# Patient Record
Sex: Female | Born: 1985 | Race: Black or African American | Hispanic: No | Marital: Single | Smoking: Former smoker
Health system: Southern US, Community
[De-identification: ages and names within clinical notes are randomized; demographics above are authoritative.]

## PROBLEM LIST (undated history)

## (undated) ENCOUNTER — Inpatient Hospital Stay (HOSPITAL_COMMUNITY): Payer: Self-pay

## (undated) DIAGNOSIS — N760 Acute vaginitis: Secondary | ICD-10-CM

## (undated) DIAGNOSIS — K219 Gastro-esophageal reflux disease without esophagitis: Secondary | ICD-10-CM

## (undated) DIAGNOSIS — B9689 Other specified bacterial agents as the cause of diseases classified elsewhere: Secondary | ICD-10-CM

## (undated) DIAGNOSIS — J40 Bronchitis, not specified as acute or chronic: Secondary | ICD-10-CM

## (undated) HISTORY — PX: WISDOM TOOTH EXTRACTION: SHX21

## (undated) HISTORY — DX: Gastro-esophageal reflux disease without esophagitis: K21.9

---

## 2006-01-06 ENCOUNTER — Inpatient Hospital Stay (HOSPITAL_COMMUNITY): Admission: AD | Admit: 2006-01-06 | Discharge: 2006-01-06 | Payer: Self-pay | Admitting: Obstetrics & Gynecology

## 2006-04-23 ENCOUNTER — Emergency Department (HOSPITAL_COMMUNITY): Admission: EM | Admit: 2006-04-23 | Discharge: 2006-04-23 | Payer: Self-pay | Admitting: Emergency Medicine

## 2006-04-28 ENCOUNTER — Inpatient Hospital Stay (HOSPITAL_COMMUNITY): Admission: AD | Admit: 2006-04-28 | Discharge: 2006-04-28 | Payer: Self-pay | Admitting: Obstetrics and Gynecology

## 2006-06-01 ENCOUNTER — Emergency Department (HOSPITAL_COMMUNITY): Admission: EM | Admit: 2006-06-01 | Discharge: 2006-06-01 | Payer: Self-pay | Admitting: Family Medicine

## 2006-07-24 ENCOUNTER — Inpatient Hospital Stay (HOSPITAL_COMMUNITY): Admission: AD | Admit: 2006-07-24 | Discharge: 2006-07-25 | Payer: Self-pay | Admitting: Gynecology

## 2006-08-19 ENCOUNTER — Emergency Department (HOSPITAL_COMMUNITY): Admission: EM | Admit: 2006-08-19 | Discharge: 2006-08-19 | Payer: Self-pay | Admitting: Emergency Medicine

## 2006-08-21 ENCOUNTER — Inpatient Hospital Stay (HOSPITAL_COMMUNITY): Admission: AD | Admit: 2006-08-21 | Discharge: 2006-08-21 | Payer: Self-pay | Admitting: Obstetrics & Gynecology

## 2006-09-04 ENCOUNTER — Ambulatory Visit: Payer: Self-pay | Admitting: Obstetrics and Gynecology

## 2006-10-08 ENCOUNTER — Encounter: Payer: Self-pay | Admitting: Obstetrics & Gynecology

## 2006-10-08 ENCOUNTER — Encounter: Payer: Self-pay | Admitting: Obstetrics and Gynecology

## 2006-10-08 ENCOUNTER — Ambulatory Visit: Payer: Self-pay | Admitting: Obstetrics & Gynecology

## 2006-10-10 ENCOUNTER — Ambulatory Visit (HOSPITAL_COMMUNITY): Admission: RE | Admit: 2006-10-10 | Discharge: 2006-10-10 | Payer: Self-pay | Admitting: Family Medicine

## 2006-10-29 ENCOUNTER — Ambulatory Visit: Payer: Self-pay | Admitting: Obstetrics & Gynecology

## 2006-12-07 ENCOUNTER — Emergency Department: Payer: Self-pay

## 2007-09-12 ENCOUNTER — Emergency Department (HOSPITAL_COMMUNITY): Admission: EM | Admit: 2007-09-12 | Discharge: 2007-09-12 | Payer: Self-pay | Admitting: Emergency Medicine

## 2008-05-05 ENCOUNTER — Emergency Department (HOSPITAL_COMMUNITY): Admission: EM | Admit: 2008-05-05 | Discharge: 2008-05-06 | Payer: Self-pay | Admitting: Emergency Medicine

## 2008-07-24 ENCOUNTER — Emergency Department (HOSPITAL_COMMUNITY): Admission: EM | Admit: 2008-07-24 | Discharge: 2008-07-24 | Payer: Self-pay | Admitting: Emergency Medicine

## 2008-07-25 ENCOUNTER — Emergency Department (HOSPITAL_COMMUNITY): Admission: EM | Admit: 2008-07-25 | Discharge: 2008-07-25 | Payer: Self-pay | Admitting: Emergency Medicine

## 2008-08-02 ENCOUNTER — Ambulatory Visit: Payer: Self-pay | Admitting: Obstetrics and Gynecology

## 2008-10-15 ENCOUNTER — Emergency Department: Payer: Self-pay | Admitting: Internal Medicine

## 2009-07-14 ENCOUNTER — Emergency Department: Payer: Self-pay | Admitting: Unknown Physician Specialty

## 2009-07-16 ENCOUNTER — Emergency Department: Payer: Self-pay | Admitting: Emergency Medicine

## 2009-08-30 ENCOUNTER — Emergency Department: Payer: Self-pay | Admitting: Emergency Medicine

## 2010-06-26 LAB — DIFFERENTIAL
Eosinophils Absolute: 0 10*3/uL (ref 0.0–0.7)
Eosinophils Relative: 0 % (ref 0–5)
Lymphocytes Relative: 12 % (ref 12–46)
Lymphs Abs: 1 10*3/uL (ref 0.7–4.0)
Monocytes Absolute: 1 10*3/uL (ref 0.1–1.0)
Neutro Abs: 6.2 10*3/uL (ref 1.7–7.7)

## 2010-06-26 LAB — CBC
HCT: 38 % (ref 36.0–46.0)
Hemoglobin: 13 g/dL (ref 12.0–15.0)
MCHC: 34.2 g/dL (ref 30.0–36.0)
Platelets: 353 10*3/uL (ref 150–400)
RDW: 15.1 % (ref 11.5–15.5)

## 2010-06-26 LAB — URINALYSIS, ROUTINE W REFLEX MICROSCOPIC
Glucose, UA: NEGATIVE mg/dL
Ketones, ur: >80 mg/dL — AB
Leukocytes, UA: NEGATIVE
Specific Gravity, Urine: >1.03 — ABNORMAL HIGH (ref 1.005–1.030)
pH: 5.5 (ref 5.0–8.0)

## 2010-06-26 LAB — COMPREHENSIVE METABOLIC PANEL
Albumin: 3.9 g/dL (ref 3.5–5.2)
Alkaline Phosphatase: 59 U/L (ref 39–117)
BUN: 7 mg/dL (ref 6–23)
CO2: 21 mEq/L (ref 19–32)
Calcium: 9.1 mg/dL (ref 8.4–10.5)
Chloride: 105 mEq/L (ref 96–112)
Creatinine, Ser: 0.85 mg/dL (ref 0.4–1.2)
GFR calc non Af Amer: >60 mL/min (ref >60)
Glucose, Bld: 79 mg/dL (ref 70–99)
Total Bilirubin: 0.5 mg/dL (ref 0.3–1.2)
Total Protein: 7.4 g/dL (ref 6.0–8.3)

## 2010-06-26 LAB — POCT CARDIAC MARKERS
CKMB, poc: 1.7 ng/mL (ref 1.0–8.0)
Myoglobin, poc: 40.1 ng/mL (ref 12–200)

## 2010-06-26 LAB — URINE MICROSCOPIC-ADD ON

## 2010-06-26 LAB — BASIC METABOLIC PANEL
BUN: 6 mg/dL (ref 6–23)
CO2: 26 mEq/L (ref 19–32)
Creatinine, Ser: 0.68 mg/dL (ref 0.4–1.2)
GFR calc Af Amer: >60 mL/min (ref >60)

## 2010-06-26 LAB — LIPASE, BLOOD: Lipase: 18 U/L (ref 11–59)

## 2010-07-03 LAB — BASIC METABOLIC PANEL
BUN: 6 mg/dL (ref 6–23)
CO2: 24 mEq/L (ref 19–32)
Calcium: 9.2 mg/dL (ref 8.4–10.5)
Chloride: 108 mEq/L (ref 96–112)
Creatinine, Ser: 0.64 mg/dL (ref 0.4–1.2)
GFR calc non Af Amer: >60 mL/min (ref >60)
Potassium: 3.5 mEq/L (ref 3.5–5.1)

## 2010-07-03 LAB — DIFFERENTIAL
Basophils Absolute: 0 10*3/uL (ref 0.0–0.1)
Basophils Relative: 1 % (ref 0–1)
Eosinophils Absolute: 0.1 10*3/uL (ref 0.0–0.7)
Eosinophils Relative: 1 % (ref 0–5)
Lymphs Abs: 1.7 10*3/uL (ref 0.7–4.0)
Monocytes Absolute: 0.7 10*3/uL (ref 0.1–1.0)
Monocytes Relative: 10 % (ref 3–12)
Neutro Abs: 4.3 10*3/uL (ref 1.7–7.7)

## 2010-07-03 LAB — GC/CHLAMYDIA PROBE AMP, GENITAL: GC Probe Amp, Genital: NEGATIVE

## 2010-07-03 LAB — URINALYSIS, ROUTINE W REFLEX MICROSCOPIC
Bilirubin Urine: NEGATIVE
Protein, ur: NEGATIVE mg/dL
pH: 6 (ref 5.0–8.0)

## 2010-07-03 LAB — ABO/RH: ABO/RH(D): O POS

## 2010-07-03 LAB — CBC
MCHC: 34.2 g/dL (ref 30.0–36.0)
MCV: 85.5 fL (ref 78.0–100.0)
Platelets: 331 10*3/uL (ref 150–400)
WBC: 6.8 10*3/uL (ref 4.0–10.5)

## 2010-07-03 LAB — WET PREP, GENITAL
Trich, Wet Prep: NONE SEEN
Yeast Wet Prep HPF POC: NONE SEEN

## 2010-07-13 ENCOUNTER — Emergency Department: Payer: Self-pay | Admitting: Emergency Medicine

## 2010-07-31 NOTE — Group Therapy Note (Signed)
NAMECHARLETTE, Diana Serrano               ACCOUNT NO.:  [id number]   MEDICAL RECORD NO.:  [id number]          PATIENT TYPE:  WOC   LOCATION:  [facility]                   FACILITY:  [facility]   PHYSICIAN:  Allie Bossier, MD        DATE OF BIRTH:  20-May-1985   DATE OF SERVICE:  10/08/2006                                  CLINIC NOTE   CHIEF COMPLAINT:  Lower abdominal pain x1 year and Pap smear.   HISTORY OF PRESENT ILLNESS:  This is a 25 year old African-American  female who reports a 1-year history of intermittent abdominal pain  located in the suprapubic region.  The pain occurs approximately twice  per month on average and is not related to any particular point in her  menstrual cycle.  When the pain occurs, it lasts approximately 30  minutes and is severe in nature and described as more severe than  menstrual cramping.  She reports that her periods are regular and  predictable with moderate flow lasting 4 days with no bleeding between  periods.  She has been evaluated in [facility] in June  2008 for this same problem.  At that time, gonorrhea and chlamydia  cultures and urinalysis were all negative.  She reports that the pain  has been increasing in intensity over the past several months.  She  reports normal bowels, no vomiting, no dysuria, no vaginal discharge.   OBSTETRIC HISTORY:  Gravida-0.   GYNECOLOGICAL HISTORY:  The patient has never had a Pap smear.  She  reports that she has been sexually active for about 4 years now.  No  history of any sexually transmitted diseases.   PAST MEDICAL HISTORY:  None.   MEDICATIONS:  None.   PAST SURGICAL HISTORY:  None.   FAMILY HISTORY:  Patient's mother has high blood pressure and  grandfather has diabetes.   SOCIAL HISTORY:  The patient denies alcohol, tobacco and drug use.  As  mentioned previously, she has been sexually active with 1 partner since  2006.  She drinks approximately 2 caffeinated beverages daily.   PHYSICAL EXAMINATION:  VITAL SIGNS:  Temperature 97.5, pulse 84, blood  pressure 104/65, weight 93 kg.  GENERAL:  Pleasant, obese African-American female.  CARDIOVASCULAR:  Regular rate and rhythm without murmur.  LUNGS:  Clear to auscultation bilaterally with normal effort.  ABDOMEN:  Obese, nontender, nondistended.  No masses.  No guarding.  No  rebound.  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  The introitus is normal.  The  vagina is pink and well-rugated with copious thin white vaginal  discharge.  The uterus appears without lesions.  Bimanual exam performed  and there is no cervical tenderness.  No adnexal tenderness.  No adnexal  fullness or masses.   IMPRESSION AND PLAN:  A 26 year old gravida-0 African-American female  who is sexually active.  She presents with a 1-year history of  intermittent abdominal pain lasting 30 minutes.  1. Abdominal pain.  Gonorrhea and chlamydia were negative last month.      Urinalysis was  also negative at that time.  In order to evaluate,      we will obtain a pelvic ultrasound to evaluate the ovaries.  2. Cervical cancer screening.  A Pap smear was obtained today and the      patient will be notified of the results when available.     ______________________________  Elsie Lincoln, MD    ______________________________  Allie Bossier, MD    KL/MEDQ  D:  10/08/2006  T:  10/09/2006  Job:  [id number]

## 2010-09-08 ENCOUNTER — Emergency Department: Payer: Self-pay | Admitting: Internal Medicine

## 2010-10-21 ENCOUNTER — Emergency Department: Payer: Self-pay | Admitting: Emergency Medicine

## 2010-11-09 ENCOUNTER — Emergency Department: Payer: Self-pay | Admitting: *Deleted

## 2010-11-23 ENCOUNTER — Inpatient Hospital Stay: Payer: Self-pay | Admitting: Psychiatry

## 2010-12-31 LAB — POCT PREGNANCY, URINE
Operator id: 149021
Preg Test, Ur: NEGATIVE

## 2011-01-03 LAB — I-STAT 8, (EC8 V) (CONVERTED LAB)
BUN: 5 — ABNORMAL LOW
Chloride: 105
HCT: 43
Hemoglobin: 14.6
Operator id: 235561
Potassium: 3.2 — ABNORMAL LOW
Sodium: 140
pCO2, Ven: 40.6 — ABNORMAL LOW

## 2011-01-03 LAB — URINALYSIS, ROUTINE W REFLEX MICROSCOPIC
Nitrite: NEGATIVE
Protein, ur: NEGATIVE
Specific Gravity, Urine: 1.02
Urobilinogen, UA: 1

## 2011-01-03 LAB — POCT URINALYSIS DIP (DEVICE)
Glucose, UA: NEGATIVE
Nitrite: NEGATIVE

## 2011-01-03 LAB — POCT PREGNANCY, URINE
Operator id: 235561
Preg Test, Ur: NEGATIVE

## 2011-01-03 LAB — TSH: TSH: 0.91

## 2011-03-31 ENCOUNTER — Emergency Department (HOSPITAL_COMMUNITY)
Admission: EM | Admit: 2011-03-31 | Discharge: 2011-03-31 | Disposition: A | Discharge status: Home / Self Care | Payer: Worker's Compensation | Attending: Emergency Medicine | Admitting: Emergency Medicine

## 2011-03-31 ENCOUNTER — Encounter (HOSPITAL_COMMUNITY): Payer: Self-pay | Admitting: Emergency Medicine

## 2011-03-31 ENCOUNTER — Emergency Department (HOSPITAL_COMMUNITY): Payer: Worker's Compensation

## 2011-03-31 DIAGNOSIS — W19XXXA Unspecified fall, initial encounter: Secondary | ICD-10-CM

## 2011-03-31 DIAGNOSIS — S300XXA Contusion of lower back and pelvis, initial encounter: Secondary | ICD-10-CM | POA: Insufficient documentation

## 2011-03-31 DIAGNOSIS — W010XXA Fall on same level from slipping, tripping and stumbling without subsequent striking against object, initial encounter: Secondary | ICD-10-CM | POA: Insufficient documentation

## 2011-03-31 DIAGNOSIS — IMO0001 Reserved for inherently not codable concepts without codable children: Secondary | ICD-10-CM | POA: Insufficient documentation

## 2011-03-31 LAB — POCT PREGNANCY, URINE: Preg Test, Ur: NEGATIVE

## 2011-03-31 NOTE — ED Notes (Signed)
Pt states she slipped on water in floor last night at work around 2130.  C/o pain to R buttocks and R leg since fall.  Denies LOC.  Denies back and neck pain. [redacted] weeks pregnant.

## 2011-03-31 NOTE — ED Provider Notes (Signed)
History     CSN: [id number]  Arrival date & time 03/31/11  1621   First MD Initiated Contact with Patient 03/31/11 1700      Chief Complaint  Patient presents with  . Fall    (Consider location/radiation/quality/duration/timing/severity/associated sxs/prior treatment) HPI.... slipped on water and fell at [facility]. Patient claims to be [redacted] weeks pregnant. Complains of pain to right buttocks. She is ambulatory without favoring the right leg. No head or neck trauma. Pain is minimal. Fall happened a short time ago.  History reviewed. No pertinent past medical history.  History reviewed. No pertinent past surgical history.  No family history on file.  History  Substance Use Topics  . Smoking status: Former Games developer  . Smokeless tobacco: Not on file  . Alcohol Use: Yes     quit drinking since becoming pregnant    OB History    Grav Para Term Preterm Abortions TAB SAB Ect Mult Living   1               Review of Systems  All other systems reviewed and are negative.    Allergies  Review of patient's allergies indicates no known allergies.  Home Medications   Current Outpatient Rx  Name Route Sig Dispense Refill  . ACETAMINOPHEN 325 MG PO TABS Oral Take 650 mg by mouth every 6 (six) hours as needed. For headache    . PRENATAL MULTIVITAMIN CH Oral Take 1 tablet by mouth daily.      BP 117/58  Pulse 93  Temp(Src) 98.4 F (36.9 C) (Oral)  Resp 20  SpO2 100%  LMP 11/02/2010  Physical Exam  Nursing note and vitals reviewed. Constitutional: She is oriented to person, place, and time. She appears well-developed and well-nourished.  HENT:  Head: Normocephalic and atraumatic.  Eyes: Conjunctivae and EOM are normal. Pupils are equal, round, and reactive to light.  Neck: Normal range of motion. Neck supple.  Cardiovascular: Normal rate and regular rhythm.   Pulmonary/Chest: Effort normal and breath sounds normal.  Abdominal: Soft. Bowel sounds are normal.    Musculoskeletal: Normal range of motion.       Minimal tenderness right lateral buttocks  Neurological: She is alert and oriented to person, place, and time.  Skin: Skin is warm and dry.  Psychiatric: She has a normal mood and affect.    ED Course  Procedures (including critical care time)   Labs Reviewed  POCT PREGNANCY, URINE  LAB REPORT - SCANNED  POCT PREGNANCY, URINE   No results found.   1. Fall   2. Contusion of buttock    No results found. Results for orders placed during the hospital encounter of 03/31/11  POCT PREGNANCY, URINE      Component Value Range   Preg Test, Ur NEGATIVE      MDM  Pregnancy test is negative. Patient is ambulatory. No x-rays are necessary. Discussed negative pregnancy test with patient.        Donnetta Hutching, MD 04/05/11 [phone]

## 2011-04-08 ENCOUNTER — Other Ambulatory Visit: Payer: Self-pay | Admitting: Occupational Medicine

## 2011-04-08 ENCOUNTER — Ambulatory Visit: Payer: Worker's Compensation

## 2011-04-08 DIAGNOSIS — M25569 Pain in unspecified knee: Secondary | ICD-10-CM

## 2011-07-20 ENCOUNTER — Emergency Department (HOSPITAL_COMMUNITY)
Admission: EM | Admit: 2011-07-20 | Discharge: 2011-07-20 | Disposition: A | Discharge status: Home / Self Care | Payer: Self-pay | Attending: Emergency Medicine | Admitting: Emergency Medicine

## 2011-07-20 ENCOUNTER — Encounter (HOSPITAL_COMMUNITY): Payer: Self-pay | Admitting: Emergency Medicine

## 2011-07-20 DIAGNOSIS — K2990 Gastroduodenitis, unspecified, without bleeding: Secondary | ICD-10-CM | POA: Insufficient documentation

## 2011-07-20 DIAGNOSIS — R112 Nausea with vomiting, unspecified: Secondary | ICD-10-CM | POA: Insufficient documentation

## 2011-07-20 DIAGNOSIS — K2970 Gastritis, unspecified, without bleeding: Secondary | ICD-10-CM | POA: Insufficient documentation

## 2011-07-20 MED ORDER — ONDANSETRON 4 MG PO TBDP
8.0000 mg | ORAL_TABLET | Freq: Once | ORAL | Status: AC
  Administered 2011-07-20: 8 mg via ORAL
  Filled 2011-07-20: qty 2

## 2011-07-20 MED ORDER — GI COCKTAIL ~~LOC~~
30.0000 mL | Freq: Once | ORAL | Status: AC
  Administered 2011-07-20: 30 mL via ORAL
  Filled 2011-07-20: qty 30

## 2011-07-20 MED ORDER — FAMOTIDINE 20 MG PO TABS
20.0000 mg | ORAL_TABLET | Freq: Once | ORAL | Status: AC
  Administered 2011-07-20: 20 mg via ORAL
  Filled 2011-07-20: qty 1

## 2011-07-20 NOTE — Discharge Instructions (Signed)
Gastritis Gastritis is an inflammation (the body's way of reacting to injury and/or infection) of the stomach. It is often caused by viral or bacterial (germ) infections. It can also be caused by chemicals (including alcohol) and medications. This illness may be associated with generalized malaise (feeling tired, not well), cramps, and fever. The illness may last 2 to 7 days. If symptoms of gastritis continue, gastroscopy (looking into the stomach with a telescope-like instrument), biopsy (taking tissue samples), and/or blood tests may be necessary to determine the cause. Antibiotics will not affect the illness unless there is a bacterial infection present. One common bacterial cause of gastritis is an organism known as H. Pylori. This can be treated with antibiotics. Other forms of gastritis are caused by too much acid in the stomach. They can be treated with medications such as H2 blockers and antacids. Home treatment is usually all that is needed. Young children will quickly become dehydrated (loss of body fluids) if vomiting and diarrhea are both present. Medications may be given to control nausea. Medications are usually not given for diarrhea unless especially bothersome. Some medications slow the removal of the virus from the gastrointestinal tract. This slows down the healing process. HOME CARE INSTRUCTIONS Home care instructions for nausea and vomiting:  For adults: drink small amounts of fluids often. Drink at least 2 quarts a day. Take sips frequently. Do not drink large amounts of fluid at one time. This may worsen the nausea.   Only take over-the-counter or prescription medicines for pain, discomfort, or fever as directed by your caregiver.   Drink clear liquids only. Those are anything you can see through such as water, broth, or soft drinks.   Once you are keeping clear liquids down, you may start full liquids, soups, juices, and ice cream or sherbet. Slowly add bland (plain, not spicy)  foods to your diet.  Home care instructions for diarrhea:  Diarrhea can be caused by bacterial infections or a virus. Your condition should improve with time, rest, fluids, and/or anti-diarrheal medication.   Until your diarrhea is under control, you should drink clear liquids often in small amounts. Clear liquids include: water, broth, jell-o water and weak tea.  Avoid:  Milk.   Fruits.   Tobacco.   Alcohol.   Extremely hot or cold fluids.   Too much intake of anything at one time.  When your diarrhea stops you may add the following foods, which help the stool to become more formed:  Rice.   Bananas.   Apples without skin.   Dry toast.  Once these foods are tolerated you may add low-fat yogurt and low-fat cottage cheese. They will help to restore the normal bacterial balance in your bowel. Wash your hands well to avoid spreading bacteria (germ) or virus. SEEK IMMEDIATE MEDICAL CARE IF:   You are unable to keep fluids down.   Vomiting or diarrhea become persistent (constant).   Abdominal pain develops, increases, or localizes. (Right sided pain can be appendicitis. Left sided pain in adults can be diverticulitis.)   You develop a fever (an oral temperature above 102 F (38.9 C)).   Diarrhea becomes excessive or contains blood or mucus.   You have excessive weakness, dizziness, fainting or extreme thirst.   You are not improving or you are getting worse.   You have any other questions or concerns.  Document Released: 02/26/2001 Document Revised: 02/21/2011 Document Reviewed: 03/04/2005 Exit[employer] Patient Information 2012 [employer]. 

## 2011-07-20 NOTE — ED Provider Notes (Signed)
History   This chart was scribed for Diana Christen, MD by Melba Coon. The patient was seen in room STRE7/STRE7 and the patient's care was started at 1:35PM.    CSN: [id number]  Arrival date & time 07/20/11  1300   First MD Initiated Contact with Patient 07/20/11 1332      Chief Complaint  Patient presents with  . Nausea  . Emesis    (Consider location/radiation/quality/duration/timing/severity/associated sxs/prior treatment) HPI Diana Serrano is a 26 y.o. female who presents to the Emergency Department complaining of persistent, moderate to severe emesis with associated nausea with an onset this morning. Pt was consuming alcohol last night and is usual to compared to baseline of her normal drinking habits. Pt woke up this morning with the present symptoms. Vomit x6. LNMP: 1 week ago. Pt is unsure if she is pregnant. Pt also c/o sternal chest pain. No HA, fever, neck pain, sore throat, rash, back pain, SOB, diarrhea, dysuria, or extremity pain, edema, weakness, numbness, or tingling. Hx of migraines. No known allergies. No other pertinent medical symptoms.  No past medical history on file.  No past surgical history on file.  No family history on file.  History  Substance Use Topics  . Smoking status: Former Games developer  . Smokeless tobacco: Not on file  . Alcohol Use: Yes     quit drinking since becoming pregnant    OB History    Grav Para Term Preterm Abortions TAB SAB Ect Mult Living   1               Review of Systems 10 Systems reviewed and all are negative for acute change except as noted in the HPI.   Allergies  Review of patient's allergies indicates no known allergies.  Home Medications  No current outpatient prescriptions on file.  BP 103/61  Pulse 84  Temp(Src) 97.7 F (36.5 C) (Oral)  Resp 20  SpO2 100%  LMP 07/15/2011  Breastfeeding? No  Physical Exam  Nursing note and vitals reviewed. Constitutional: She is oriented to person, place, and  time. She appears well-developed and well-nourished. No distress.  HENT:  Head: Normocephalic and atraumatic.  Eyes: EOM are normal. Pupils are equal, round, and reactive to light.  Neck: Normal range of motion. Neck supple. No tracheal deviation present.  Cardiovascular: Normal rate.   No murmur heard. Pulmonary/Chest: Effort normal. No respiratory distress.  Abdominal: Soft. There is no tenderness.  Musculoskeletal: Normal range of motion. She exhibits no edema and no tenderness.  Neurological: She is alert and oriented to person, place, and time.  Skin: Skin is warm and dry.  Psychiatric: She has a normal mood and affect. Her behavior is normal.    ED Course  Procedures (including critical care time)  DIAGNOSTIC STUDIES: Oxygen Saturation is 100% on room air, normal by my interpretation.    COORDINATION OF CARE:  1:37PM - EDMD will order Zofran, PO pepsin, and GI cocktail for the pt.  Labs Reviewed - No data to display No results found.   No diagnosis found.    MDM  Patient with symptoms that may be due to alcohol intake last night.  Patient has a soft and benign abdomen on exam so I do not feel she is an acute surgical process at this time.  She has no tenderness to suggest pancreatitis, cholecystitis or hepatitis.  Patient is afebrile here as well.  Patient may have some mild gastritis and irritation to her esophagus from the vomiting  and acid reflux.  I believe these are the likely etiologies for her chest pain.  Patient has been counseled regarding decreasing alcohol intake here.  I will give her Zofran for her nausea and Pepcid and a GI cocktail to assist with her symptoms.  I personally performed the services described in this documentation, which was scribed in my presence. The recorded information has been reviewed and considered.         Diana Christen, MD 07/20/11 1345

## 2011-07-20 NOTE — ED Notes (Signed)
Patient advises that she has had Nausea and Vomiting since  am. She advises that she has vomited approximately 6 times. She also complains of heaviness in the chest area.  LMP 1 week ago and she is unsure if she is pregnant.

## 2011-08-01 ENCOUNTER — Emergency Department (HOSPITAL_COMMUNITY): Payer: No Typology Code available for payment source

## 2011-08-01 ENCOUNTER — Emergency Department (HOSPITAL_COMMUNITY)
Admission: EM | Admit: 2011-08-01 | Discharge: 2011-08-01 | Disposition: A | Discharge status: Home / Self Care | Payer: Worker's Compensation | Attending: Emergency Medicine | Admitting: Emergency Medicine

## 2011-08-01 ENCOUNTER — Encounter (HOSPITAL_COMMUNITY): Payer: Self-pay | Admitting: Physical Medicine and Rehabilitation

## 2011-08-01 ENCOUNTER — Emergency Department (HOSPITAL_COMMUNITY): Payer: Self-pay

## 2011-08-01 DIAGNOSIS — M25569 Pain in unspecified knee: Secondary | ICD-10-CM | POA: Insufficient documentation

## 2011-08-01 DIAGNOSIS — M25519 Pain in unspecified shoulder: Secondary | ICD-10-CM | POA: Insufficient documentation

## 2011-08-01 DIAGNOSIS — M7989 Other specified soft tissue disorders: Secondary | ICD-10-CM | POA: Insufficient documentation

## 2011-08-01 DIAGNOSIS — IMO0002 Reserved for concepts with insufficient information to code with codable children: Secondary | ICD-10-CM | POA: Insufficient documentation

## 2011-08-01 MED ORDER — DIAZEPAM 5 MG PO TABS: 5.0000 mg | ORAL_TABLET | Freq: Two times a day (BID) | ORAL | Status: AC

## 2011-08-01 MED ORDER — IBUPROFEN 800 MG PO TABS: 800.0000 mg | ORAL_TABLET | Freq: Three times a day (TID) | ORAL | Status: AC
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
## 2011-08-01 NOTE — ED Provider Notes (Signed)
Medical screening examination/treatment/procedure(s) were performed by non-physician practitioner and as supervising physician I was immediately available for consultation/collaboration.  Flint Melter, MD 08/01/11 [phone]

## 2011-08-01 NOTE — Progress Notes (Signed)
Orthopedic Tech Progress Note Patient Details:  Diana Serrano June 17, 1985 [id number]  Other Ortho Devices Type of Ortho Device: ASO;Crutches Ortho Device Location: left ankle Ortho Device Interventions: Application   Batina Dougan T 08/01/2011, 11:00 AM
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
## 2011-08-01 NOTE — ED Notes (Signed)
Pt transported to and from radiology in wheelchair with tech and tolerated well. 

## 2011-08-01 NOTE — Discharge Instructions (Signed)
Your knee xray was normal and did not show signs of any broken bones. This is likely swelling due to the abrasion to your knee. Apply ice to the area and elevate the leg. If you develop trouble walking, numbness/weakness, increased swelling, or any other worrisome symptoms, return to the emergency department. You have been given prescriptions for ibuprofen and Valium (muscle relaxer) - take as needed.  RESOURCE GUIDE  Dental Problems  Patients with Medicaid: [facility] [phone] [address]                                           [phone] [address] Phone:  [phone]                                                  Phone:  [phone]  If unable to pay or uninsured, contact:  [facility] or [facility]. to become qualified for the adult dental clinic.  Chronic Pain Problems Contact [facility]  [phone] Patients need to be referred by their primary care doctor.  Insufficient Money for Medicine Contact [employer]:  call "211" or [facility] [phone].  No Primary Care Doctor Call [facility]  [phone] Other agencies that provide inexpensive medical care    [facility]  [phone]    [facility]  [phone]    [facility]  [phone]    [facility]  [phone]    [facility]  [phone]    [facility]  [phone]  Psychological Services [facility]  [phone] [facility]  [phone] [facility]   [phone] (emergency services [phone])  Substance Abuse Resources [facility]  [phone] [facility] [phone] [facility] [place] [phone] Floydene Flock [phone] Residential & Outpatient Substance Abuse Program  [phone]) [phone]  Abuse/Neglect [facility] Child Abuse Hotline (712)398-3663 [facility] Child Abuse Hotline 985-765-9977 (After  Hours)  Emergency Shelter [facility] [phone]  Maternity Homes [facility] at [facility] [place] [facility] [phone])706-4446 [facility] [phone]  MRSA Hotline #:   (607) 876-5870    [facility] Resources  [facility]     [employer]                          [facility] Dept. [address] [place]                       [address]      [address]  Patrecia Pace  Michell Heinrich Phone:  [phone]                                   Phone:  [phone]                 Phone:  [phone]  [facility] Mental Health Phone:  [phone]  [facility] Child Abuse Hotline 773-045-5132 (214)084-8498 (After Hours)  Knee Pain The knee is the complex joint between your thigh and your lower leg. It is made up of bones, tendons, ligaments, and cartilage. The bones that make up the knee are:  The femur in the thigh.   The tibia and fibula in the lower leg.   The patella or kneecap riding in the groove on the lower femur.  CAUSES  Knee pain is a common complaint with many causes. A few of these causes are:  Injury, such as:   A ruptured ligament or tendon injury.   Torn cartilage.   Medical conditions, such as:   Gout   Arthritis   Infections   Overuse, over training or overdoing a physical activity.  Knee pain can be minor or severe. Knee pain can accompany debilitating injury. Minor knee problems often respond well to self-care measures or get well on their own. More serious injuries may need medical intervention or even surgery. SYMPTOMS The knee is complex. Symptoms of knee problems can vary widely. Some of the problems are:  Pain with movement and weight bearing.   Swelling and tenderness.   Buckling of the knee.   Inability to straighten or extend your knee.   Your knee locks and you cannot straighten it.    Warmth and redness with pain and fever.   Deformity or dislocation of the kneecap.  DIAGNOSIS  Determining what is wrong may be very straight forward such as when there is an injury. It can also be challenging because of the complexity of the knee. Tests to make a diagnosis may include:  Your caregiver taking a history and doing a physical exam.   Routine X-rays can be used to rule out other problems. X-rays will not reveal a cartilage tear. Some injuries of the knee can be diagnosed by:   Arthroscopy a surgical technique by which a small video camera is inserted through tiny incisions on the sides of the knee. This procedure is used to examine and repair internal knee joint problems. Tiny instruments can be used during arthroscopy to repair the torn knee cartilage (meniscus).   Arthrography is a radiology technique. A contrast liquid is directly injected into the knee joint. Internal structures of the knee joint then become visible on X-ray film.   An MRI scan is a non x-ray radiology procedure in which magnetic fields and a computer produce two- or three-dimensional images of the inside of the knee. Cartilage tears are often visible using an MRI scanner. MRI scans have largely replaced arthrography in diagnosing cartilage tears of the knee.   Blood work.   Examination of the fluid that helps to lubricate the knee joint (synovial fluid). This is done by taking a sample out using a needle and a syringe.  TREATMENT The treatment of knee problems depends on the cause. Some of these treatments are:  Depending on the injury, proper casting, splinting, surgery or physical therapy care will be needed.   Give yourself adequate recovery time. Do not overuse your joints. If you begin  to get sore during workout routines, back off. Slow down or do fewer repetitions.   For repetitive activities such as cycling or running, maintain your strength and nutrition.   Alternate muscle groups. For  example if you are a weight lifter, work the upper body on one day and the lower body the next.   Either tight or weak muscles do not give the proper support for your knee. Tight or weak muscles do not absorb the stress placed on the knee joint. Keep the muscles surrounding the knee strong.   Take care of mechanical problems.   If you have flat feet, orthotics or special shoes may help. See your caregiver if you need help.   Arch supports, sometimes with wedges on the inner or outer aspect of the heel, can help. These can shift pressure away from the side of the knee most bothered by osteoarthritis.   A brace called an "unloader" brace also may be used to help ease the pressure on the most arthritic side of the knee.   If your caregiver has prescribed crutches, braces, wraps or ice, use as directed. The acronym for this is PRICE. This means protection, rest, ice, compression and elevation.   Nonsteroidal anti-inflammatory drugs (NSAID's), can help relieve pain. But if taken immediately after an injury, they may actually increase swelling. Take NSAID's with food in your stomach. Stop them if you develop stomach problems. Do not take these if you have a history of ulcers, stomach pain or bleeding from the bowel. Do not take without your caregiver's approval if you have problems with fluid retention, heart failure, or kidney problems.   For ongoing knee problems, physical therapy may be helpful.   Glucosamine and chondroitin are over-the-counter dietary supplements. Both may help relieve the pain of osteoarthritis in the knee. These medicines are different from the usual anti-inflammatory drugs. Glucosamine may decrease the rate of cartilage destruction.   Injections of a corticosteroid drug into your knee joint may help reduce the symptoms of an arthritis flare-up. They may provide pain relief that lasts a few months. You may have to wait a few months between injections. The injections do have a  small increased risk of infection, water retention and elevated blood sugar levels.   Hyaluronic acid injected into damaged joints may ease pain and provide lubrication. These injections may work by reducing inflammation. A series of shots may give relief for as long as 6 months.   Topical painkillers. Applying certain ointments to your skin may help relieve the pain and stiffness of osteoarthritis. Ask your pharmacist for suggestions. Many over the-counter products are approved for temporary relief of arthritis pain.   In some countries, doctors often prescribe topical NSAID's for relief of chronic conditions such as arthritis and tendinitis. A review of treatment with NSAID creams found that they worked as well as oral medications but without the serious side effects.  PREVENTION  Maintain a healthy weight. Extra pounds put more strain on your joints.   Get strong, stay limber. Weak muscles are a common cause of knee injuries. Stretching is important. Include flexibility exercises in your workouts.   Be smart about exercise. If you have osteoarthritis, chronic knee pain or recurring injuries, you may need to change the way you exercise. This does not mean you have to stop being active. If your knees ache after jogging or playing basketball, consider switching to swimming, water aerobics or other low-impact activities, at least for a few days a week. Sometimes  limiting high-impact activities will provide relief.   Make sure your shoes fit well. Choose footwear that is right for your sport.   Protect your knees. Use the proper gear for knee-sensitive activities. Use kneepads when playing volleyball or laying carpet. Buckle your seat belt every time you drive. Most shattered kneecaps occur in car accidents.   Rest when you are tired.  SEEK MEDICAL CARE IF:  You have knee pain that is continual and does not seem to be getting better.  SEEK IMMEDIATE MEDICAL CARE IF:  Your knee joint feels hot to  the touch and you have a high fever. MAKE SURE YOU:   Understand these instructions.   Will watch your condition.   Will get help right away if you are not doing well or get worse.  Document Released: 12/30/2006 Document Revised: 02/21/2011 Document Reviewed: 12/30/2006 [facility] Patient Information 2012 [place], [place].

## 2011-08-06 ENCOUNTER — Encounter (HOSPITAL_COMMUNITY): Payer: Self-pay | Admitting: *Deleted

## 2011-08-06 ENCOUNTER — Emergency Department (HOSPITAL_COMMUNITY)
Admission: EM | Admit: 2011-08-06 | Discharge: 2011-08-06 | Disposition: A | Discharge status: Home / Self Care | Payer: Worker's Compensation | Attending: Emergency Medicine | Admitting: Emergency Medicine

## 2011-08-06 DIAGNOSIS — IMO0002 Reserved for concepts with insufficient information to code with codable children: Secondary | ICD-10-CM | POA: Insufficient documentation

## 2011-08-06 DIAGNOSIS — S80212A Abrasion, left knee, initial encounter: Secondary | ICD-10-CM

## 2011-08-06 DIAGNOSIS — Z09 Encounter for follow-up examination after completed treatment for conditions other than malignant neoplasm: Secondary | ICD-10-CM | POA: Insufficient documentation
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
## 2011-08-06 NOTE — Discharge Instructions (Signed)
Abrasions Abrasions are skin scrapes. Their treatment depends on how large and deep the abrasion is. Abrasions do not extend through all layers of the skin. A cut or lesion through all skin layers is called a laceration. HOME CARE INSTRUCTIONS   If you were given a dressing, change it at least once a day or as instructed by your caregiver. If the bandage sticks, soak it off with a solution of water or hydrogen peroxide.   Twice a day, wash the area with soap and water to remove all the cream/ointment. You may do this in a sink, under a tub faucet, or in a shower. Rinse off the soap and pat dry with a clean towel. Look for signs of infection (see below).   Reapply cream/ointment according to your caregiver's instruction. This will help prevent infection and keep the bandage from sticking. Telfa or gauze over the wound and under the dressing or wrap will also help keep the bandage from sticking.   If the bandage becomes wet, dirty, or develops a foul smell, change it as soon as possible.   Only take over-the-counter or prescription medicines for pain, discomfort, or fever as directed by your caregiver.  SEEK IMMEDIATE MEDICAL CARE IF:   Increasing pain in the wound.   Signs of infection develop: redness, swelling, surrounding area is tender to touch, or pus coming from the wound.   You have a fever.   Any foul smell coming from the wound or dressing.  Most skin wounds heal within ten days. Facial wounds heal faster. However, an infection may occur despite proper treatment. You should have the wound checked for signs of infection within 24 to 48 hours or sooner if problems arise. If you were not given a wound-check appointment, look closely at the wound yourself on the second day for early signs of infection listed above. MAKE SURE YOU:   Understand these instructions.   Will watch your condition.   Will get help right away if you are not doing well or get worse.  Document Released:  12/12/2004 Document Revised: 02/21/2011 Document Reviewed: 02/05/2011 [employer] Patient Information 2012 [employer]. 

## 2011-08-10 NOTE — ED Provider Notes (Signed)
Medical screening examination/treatment/procedure(s) were performed by non-physician practitioner and as supervising physician I was immediately available for consultation/collaboration.  Shelda Jakes, MD 08/10/11 [phone]

## 2011-12-29 ENCOUNTER — Encounter (HOSPITAL_COMMUNITY): Payer: Self-pay | Admitting: *Deleted

## 2011-12-29 ENCOUNTER — Emergency Department (HOSPITAL_COMMUNITY)
Admission: EM | Admit: 2011-12-29 | Discharge: 2011-12-29 | Disposition: A | Discharge status: Home / Self Care | Payer: Self-pay | Attending: Emergency Medicine | Admitting: Emergency Medicine

## 2011-12-29 DIAGNOSIS — M25569 Pain in unspecified knee: Secondary | ICD-10-CM | POA: Insufficient documentation

## 2011-12-29 DIAGNOSIS — F17200 Nicotine dependence, unspecified, uncomplicated: Secondary | ICD-10-CM | POA: Insufficient documentation

## 2011-12-29 DIAGNOSIS — L0390 Cellulitis, unspecified: Secondary | ICD-10-CM

## 2011-12-29 DIAGNOSIS — K089 Disorder of teeth and supporting structures, unspecified: Secondary | ICD-10-CM | POA: Insufficient documentation

## 2011-12-29 MED ORDER — SULFAMETHOXAZOLE-TRIMETHOPRIM 800-160 MG PO TABS: 1.0000 | ORAL_TABLET | Freq: Two times a day (BID) | ORAL | Status: DC

## 2011-12-29 MED ORDER — CEPHALEXIN 500 MG PO CAPS: 500.0000 mg | ORAL_CAPSULE | Freq: Four times a day (QID) | ORAL | Status: DC

## 2011-12-29 MED ORDER — HYDROCODONE-ACETAMINOPHEN 5-325 MG PO TABS: 1.0000 | ORAL_TABLET | Freq: Four times a day (QID) | ORAL | Status: DC | PRN

## 2011-12-29 MED ORDER — IBUPROFEN 600 MG PO TABS: 600.0000 mg | ORAL_TABLET | Freq: Four times a day (QID) | ORAL | Status: DC | PRN

## 2011-12-29 NOTE — ED Notes (Signed)
Pt discharged to home with prescriptions. Pt voiced understanding of discharge instructions and prescriptions. NAD.
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
## 2011-12-29 NOTE — ED Notes (Signed)
Pt reports to ED with complaints of dental pain on right side of mouth when chewing. Pt states she normally takes motrin and this helps the pain but it did not help this time.

## 2011-12-29 NOTE — ED Notes (Signed)
Pt is here with right side pain to mouth.  Left knee bothering her for the last couple of days

## 2012-01-28 ENCOUNTER — Emergency Department (HOSPITAL_COMMUNITY)
Admission: EM | Admit: 2012-01-28 | Discharge: 2012-01-28 | Disposition: A | Discharge status: Home / Self Care | Payer: Self-pay | Attending: Emergency Medicine | Admitting: Emergency Medicine

## 2012-01-28 ENCOUNTER — Encounter (HOSPITAL_COMMUNITY): Payer: Self-pay | Admitting: Emergency Medicine

## 2012-01-28 DIAGNOSIS — J069 Acute upper respiratory infection, unspecified: Secondary | ICD-10-CM | POA: Insufficient documentation

## 2012-01-28 DIAGNOSIS — A5901 Trichomonal vulvovaginitis: Secondary | ICD-10-CM | POA: Insufficient documentation

## 2012-01-28 DIAGNOSIS — Z8669 Personal history of other diseases of the nervous system and sense organs: Secondary | ICD-10-CM | POA: Insufficient documentation

## 2012-01-28 DIAGNOSIS — R05 Cough: Secondary | ICD-10-CM | POA: Insufficient documentation

## 2012-01-28 DIAGNOSIS — R059 Cough, unspecified: Secondary | ICD-10-CM | POA: Insufficient documentation

## 2012-01-28 DIAGNOSIS — Z3202 Encounter for pregnancy test, result negative: Secondary | ICD-10-CM | POA: Insufficient documentation

## 2012-01-28 DIAGNOSIS — H9209 Otalgia, unspecified ear: Secondary | ICD-10-CM | POA: Insufficient documentation

## 2012-01-28 DIAGNOSIS — F17200 Nicotine dependence, unspecified, uncomplicated: Secondary | ICD-10-CM | POA: Insufficient documentation

## 2012-01-28 DIAGNOSIS — R109 Unspecified abdominal pain: Secondary | ICD-10-CM | POA: Insufficient documentation

## 2012-01-28 LAB — URINALYSIS, ROUTINE W REFLEX MICROSCOPIC
Bilirubin Urine: NEGATIVE
Glucose, UA: NEGATIVE mg/dL
Ketones, ur: NEGATIVE mg/dL
Protein, ur: NEGATIVE mg/dL
Urobilinogen, UA: 1 mg/dL (ref 0.0–1.0)

## 2012-01-28 LAB — URINE MICROSCOPIC-ADD ON

## 2012-01-28 LAB — WET PREP, GENITAL

## 2012-01-28 MED ORDER — ACETAMINOPHEN 500 MG PO TABS
1000.0000 mg | ORAL_TABLET | Freq: Once | ORAL | Status: AC
  Administered 2012-01-28: 1000 mg via ORAL
  Filled 2012-01-28: qty 2

## 2012-01-28 MED ORDER — BENZONATATE 100 MG PO CAPS: 100.0000 mg | ORAL_CAPSULE | Freq: Three times a day (TID) | ORAL | Status: DC | PRN

## 2012-01-28 MED ORDER — METRONIDAZOLE 500 MG PO TABS
2000.0000 mg | ORAL_TABLET | Freq: Once | ORAL | Status: AC
  Administered 2012-01-28: 2000 mg via ORAL
  Filled 2012-01-28: qty 4

## 2012-01-28 MED ORDER — IBUPROFEN 400 MG PO TABS
400.0000 mg | ORAL_TABLET | Freq: Once | ORAL | Status: AC
  Administered 2012-01-28: 400 mg via ORAL
  Filled 2012-01-28: qty 1

## 2012-01-28 NOTE — ED Notes (Signed)
Pt c/o sore throat and cough with lower abd pain and cramping; pt sts LMP was 10/23 and thinks she could be pregnant

## 2012-01-28 NOTE — ED Notes (Signed)
Urine sample provided.

## 2012-01-28 NOTE — ED Notes (Signed)
Pt started with laryngitis last Friday with cough, abdominal pain x 1 week, states thought period was started, period was due 10/23, pt states possibility could be pregnant. States vomited Friday night and Saturday morning

## 2012-01-28 NOTE — ED Provider Notes (Signed)
History  This chart was scribed for Laray Anger, DO by Bennett Scrape, ED Scribe. This patient was seen in room TR08C/TR08C and the patient's care was started at 1:33PM.   CSN: [id number]  Arrival date & time 01/28/12  1123   First MD Initiated Contact with Patient 01/28/12 1333      Chief Complaint  Patient presents with  . Abdominal Pain  . Sore Throat     The history is provided by the patient. No language interpreter was used.   Diana Serrano is a 26 y.o. female who presents to the Emergency Department complaining of gradual onset and persistence of constant sore throat for the past 5 days.  Has been associated with ear aches and cough. She reports a sick contact at home with similar symptoms. She also c/o gradual onset and persistence of constant lower abdominal "pain" for the past 1 week.  Describes the pain as cramping. LMP was 01/08/12 and she reports the possibility that she could be pregnant.  Denies fevers, no CP/SOB, no back pain, no N/V/D, no rash.    Past Medical History  Diagnosis Date  . Migraine     History reviewed. No pertinent past surgical history.   History  Substance Use Topics  . Smoking status: Current Some Day Smoker  . Smokeless tobacco: Not on file  . Alcohol Use: Yes    Review of Systems ROS: Statement: All systems negative except as marked or noted in the HPI; Constitutional: Negative for fever and chills. ; ; Eyes: Negative for eye pain, redness and discharge. ; ; ENMT: +ears pain, nasal congestion, sinus pressure and sore throat. ; ; Cardiovascular: Negative for chest pain, palpitations, diaphoresis, dyspnea and peripheral edema. ; ; Respiratory: +cough. Negative for wheezing and stridor. ; ; Gastrointestinal: Negative for nausea, vomiting, diarrhea, blood in stool, hematemesis, jaundice and rectal bleeding. . ; ; Genitourinary: Negative for dysuria, flank pain and hematuria. ; ; GYN:  +pelvic pain. No vaginal bleeding, no vaginal  discharge, no vulvar pain.;; Musculoskeletal: Negative for back pain and neck pain. Negative for swelling and trauma.; ; Skin: Negative for pruritus, rash, abrasions, blisters, bruising and skin lesion.; ; Neuro: Negative for headache, lightheadedness and neck stiffness. Negative for weakness, altered level of consciousness , altered mental status, extremity weakness, paresthesias, involuntary movement, seizure and syncope.       Allergies  Review of patient's allergies indicates no known allergies.  Home Medications  No current outpatient prescriptions on file.  Triage Vitals: BP 127/80  Pulse 87  Temp 98.3 F (36.8 C) (Oral)  Resp 20  SpO2 99%  Physical Exam 1405: Physical examination:  Nursing notes reviewed; Vital signs and O2 SAT reviewed;  Constitutional: Well developed, Well nourished, Well hydrated, In no acute distress; Head:  Normocephalic, atraumatic; Eyes: EOMI, PERRL, No scleral icterus; ENMT: TM's clear bilat, +edemetous nasal turbinates bilat with clear rhinorrhea.  Mouth and pharynx normal, Mucous membranes moist. No intra-oral edema, no drooling, no stridor.; Neck: Supple, Full range of motion, No lymphadenopathy; Cardiovascular: Regular rate and rhythm, No murmur, rub, or gallop; Respiratory: Breath sounds clear & equal bilaterally, No rales, rhonchi, wheezes.  Speaking full sentences with ease, Normal respiratory effort/excursion; Chest: Nontender, Movement normal; Abdomen: Soft, Nontender, Nondistended, Normal bowel sounds; Genitourinary: No CVA tenderness. Pelvic exam performed with permission of pt and female ED tech assist during exam.  External genitalia w/o lesions. Vaginal vault without discharge, scant amount of bleeding.  Cervix w/o lesions, not friable, GC/chlam and  wet prep obtained and sent to lab.  Bimanual exam w/o CMT, uterine or adnexal tenderness.;; Extremities: Pulses normal, No tenderness, No edema, No calf edema or asymmetry.; Neuro: AA&Ox3, Major CN grossly  intact.  Speech clear. No gross focal motor or sensory deficits in extremities.; Skin: Color normal, Warm, Dry.   ED Course  Procedures   DIAGNOSTIC STUDIES: Oxygen Saturation is 99% on room air, normal by my interpretation.    COORDINATION OF CARE:  1:59 PM- Informed pt of negative pregnancy test. Discussed treatment plan which includes a pelvic exam with pt at bedside and pt agreed to plan.  2:00 PM- Performed pelvic exam.    MDM  MDM Reviewed: nursing note and vitals Interpretation: labs     Results for orders placed during the hospital encounter of 01/28/12  URINALYSIS, ROUTINE W REFLEX MICROSCOPIC      Component Value Range   Color, Urine YELLOW  YELLOW   APPearance CLOUDY (*) CLEAR   Specific Gravity, Urine 1.025  1.005 - 1.030   pH 6.0  5.0 - 8.0   Glucose, UA NEGATIVE  NEGATIVE mg/dL   Hgb urine dipstick LARGE (*) NEGATIVE   Bilirubin Urine NEGATIVE  NEGATIVE   Ketones, ur NEGATIVE  NEGATIVE mg/dL   Protein, ur NEGATIVE  NEGATIVE mg/dL   Urobilinogen, UA 1.0  0.0 - 1.0 mg/dL   Nitrite NEGATIVE  NEGATIVE   Leukocytes, UA SMALL (*) NEGATIVE  RAPID STREP SCREEN      Component Value Range   Streptococcus, Group A Screen (Direct) NEGATIVE  NEGATIVE  POCT PREGNANCY, URINE      Component Value Range   Preg Test, Ur NEGATIVE  NEGATIVE  URINE MICROSCOPIC-ADD ON      Component Value Range   Squamous Epithelial / LPF MANY (*) RARE   WBC, UA 0-2  <3 WBC/hpf   RBC / HPF 3-6  <3 RBC/hpf   Bacteria, UA FEW (*) RARE   Urine-Other TRICHOMONAS PRESENT    WET PREP, GENITAL      Component Value Range   Yeast Wet Prep HPF POC NONE SEEN  NONE SEEN   Trich, Wet Prep TOO NUMEROUS TO COUNT (*) NONE SEEN   Clue Cells Wet Prep HPF POC NONE SEEN  NONE SEEN   WBC, Wet Prep HPF POC FEW (*) NONE SEEN     1630:  Will tx for trich while in the ED.  GC/chlam pending.  Tx URI symptomatically at this time.  Dx and testing d/w pt.  Questions answered.  Verb understanding, agreeable to  d/c home with outpt f/u.      I personally performed the services described in this documentation, which was scribed in my presence. The recorded information has been reviewed and is accurate.    Laray Anger, DO 01/30/12 1339

## 2012-01-29 LAB — GC/CHLAMYDIA PROBE AMP
CT Probe RNA: NEGATIVE
GC Probe RNA: NEGATIVE

## 2012-06-29 ENCOUNTER — Inpatient Hospital Stay (HOSPITAL_COMMUNITY)
Admission: AD | Admit: 2012-06-29 | Discharge: 2012-06-29 | Disposition: A | Discharge status: Home / Self Care | Payer: BC Managed Care – PPO | Source: Ambulatory Visit | Attending: Obstetrics and Gynecology | Admitting: Obstetrics and Gynecology

## 2012-06-29 ENCOUNTER — Encounter (HOSPITAL_COMMUNITY): Payer: Self-pay | Admitting: *Deleted

## 2012-06-29 DIAGNOSIS — Z3202 Encounter for pregnancy test, result negative: Secondary | ICD-10-CM

## 2012-06-29 LAB — URINALYSIS, ROUTINE W REFLEX MICROSCOPIC
Bilirubin Urine: NEGATIVE
Nitrite: NEGATIVE
Protein, ur: NEGATIVE mg/dL
Specific Gravity, Urine: 1.02 (ref 1.005–1.030)
Urobilinogen, UA: 0.2 mg/dL (ref 0.0–1.0)

## 2012-06-29 LAB — HCG, SERUM, QUALITATIVE: Preg, Serum: NEGATIVE

## 2012-06-29 MED ORDER — ONDANSETRON 4 MG PO TBDP
4.0000 mg | ORAL_TABLET | Freq: Once | ORAL | Status: AC
Start: 2012-06-29 — End: 2012-06-29
  Administered 2012-06-29: 4 mg via ORAL
  Filled 2012-06-29: qty 1

## 2012-06-29 MED ORDER — IBUPROFEN 800 MG PO TABS
800.0000 mg | ORAL_TABLET | Freq: Once | ORAL | Status: AC
  Administered 2012-06-29: 800 mg via ORAL
  Filled 2012-06-29: qty 1

## 2012-06-29 NOTE — MAU Note (Signed)
Pt reports pain in her lower abd x 2 weeks, states her period only lasted 2 days and it was spotting. Also reports nausea in the am's. States she thinks she is pregnant but has not done a preg test. States she can't take this anymore, she just has to find out.

## 2012-06-29 NOTE — MAU Provider Note (Signed)
History     CSN: [id number]  Arrival date and time: 06/29/12 4098   None     Chief Complaint  Patient presents with  . Possible Pregnancy   HPI  Diana Serrano is a 27 y.o. G1P0 she states her LMP 06/19/12, and it was only 2 days. She states that her normal menstrual cycle is about 5 days.  She has also been having nausea for the past week and vomiting once a day in the morning. She states that she is not on anything for contraception. She is here tonight because she would like a pregnancy test.   Past Medical History  Diagnosis Date  . Migraine     History reviewed. No pertinent past surgical history.  History reviewed. No pertinent family history.  History  Substance Use Topics  . Smoking status: Current Some Day Smoker  . Smokeless tobacco: Not on file  . Alcohol Use: Yes     Comment: OCCAS- VODKA    Allergies: No Known Allergies  Prescriptions prior to admission  Medication Sig Dispense Refill  . benzonatate (TESSALON) 100 MG capsule Take 1 capsule (100 mg total) by mouth 3 (three) times daily as needed for cough.  20 capsule  0    Review of Systems  Constitutional: Negative for fever.  Eyes: Negative for blurred vision.  Gastrointestinal: Positive for nausea and vomiting. Negative for diarrhea.  Genitourinary: Negative for dysuria, urgency and frequency.  Neurological: Negative for dizziness.   Physical Exam   Blood pressure 115/66, pulse 81, temperature 98.4 F (36.9 C), temperature source Oral, resp. rate 20, height 5\' 4"  (1.626 m), weight 243 lb (110.224 kg), last menstrual period 06/19/2012, SpO2 100.00%.  Physical Exam  Nursing note and vitals reviewed. Constitutional: She is oriented to person, place, and time. She appears well-developed and well-nourished. No distress.  Cardiovascular: Normal rate.   GI: Soft.  Neurological: She is alert and oriented to person, place, and time.  Skin: Skin is warm and dry.  Psychiatric: She has a normal mood and  affect.    MAU Course  Procedures  Results for orders placed during the hospital encounter of 06/29/12 (from the past 24 hour(s))  URINALYSIS, ROUTINE W REFLEX MICROSCOPIC     Status: Abnormal   Collection Time    06/29/12  2:53 AM      Result Value Range   Color, Urine YELLOW  YELLOW   APPearance HAZY (*) CLEAR   Specific Gravity, Urine 1.020  1.005 - 1.030   pH 7.5  5.0 - 8.0   Glucose, UA NEGATIVE  NEGATIVE mg/dL   Hgb urine dipstick SMALL (*) NEGATIVE   Bilirubin Urine NEGATIVE  NEGATIVE   Ketones, ur NEGATIVE  NEGATIVE mg/dL   Protein, ur NEGATIVE  NEGATIVE mg/dL   Urobilinogen, UA 0.2  0.0 - 1.0 mg/dL   Nitrite NEGATIVE  NEGATIVE   Leukocytes, UA NEGATIVE  NEGATIVE  URINE MICROSCOPIC-ADD ON     Status: None   Collection Time    06/29/12  2:53 AM      Result Value Range   Squamous Epithelial / LPF RARE  RARE   WBC, UA 0-2  <3 WBC/hpf   RBC / HPF 0-2  <3 RBC/hpf  POCT PREGNANCY, URINE     Status: None   Collection Time    06/29/12  3:02 AM      Result Value Range   Preg Test, Ur NEGATIVE  NEGATIVE  HCG, SERUM, QUALITATIVE     Status:  None   Collection Time    06/29/12  3:05 AM      Result Value Range   Preg, Serum NEGATIVE  NEGATIVE     Assessment and Plan   1. Negative pregnancy test    FU with PCP as needed  Tawnya Crook 06/29/2012, 3:51 AM

## 2012-06-30 NOTE — MAU Provider Note (Signed)
Attestation of Attending Supervision of Advanced Practitioner: Evaluation and management procedures were performed by the PA/NP/CNM/OB Fellow under my supervision/collaboration. Chart reviewed and agree with management and plan.  Melany Wiesman V 06/30/2012 12:06 PM

## 2012-08-12 ENCOUNTER — Encounter (HOSPITAL_COMMUNITY): Payer: Self-pay | Admitting: Emergency Medicine

## 2012-08-12 ENCOUNTER — Emergency Department (HOSPITAL_COMMUNITY)
Admission: EM | Admit: 2012-08-12 | Discharge: 2012-08-12 | Disposition: A | Discharge status: Home / Self Care | Payer: BC Managed Care – PPO | Attending: Emergency Medicine | Admitting: Emergency Medicine

## 2012-08-12 DIAGNOSIS — Z8709 Personal history of other diseases of the respiratory system: Secondary | ICD-10-CM | POA: Insufficient documentation

## 2012-08-12 DIAGNOSIS — Z8679 Personal history of other diseases of the circulatory system: Secondary | ICD-10-CM | POA: Insufficient documentation

## 2012-08-12 DIAGNOSIS — F17200 Nicotine dependence, unspecified, uncomplicated: Secondary | ICD-10-CM | POA: Insufficient documentation

## 2012-08-12 DIAGNOSIS — Z3202 Encounter for pregnancy test, result negative: Secondary | ICD-10-CM | POA: Insufficient documentation

## 2012-08-12 DIAGNOSIS — R112 Nausea with vomiting, unspecified: Secondary | ICD-10-CM | POA: Insufficient documentation

## 2012-08-12 DIAGNOSIS — J069 Acute upper respiratory infection, unspecified: Secondary | ICD-10-CM | POA: Insufficient documentation

## 2012-08-12 DIAGNOSIS — J3489 Other specified disorders of nose and nasal sinuses: Secondary | ICD-10-CM | POA: Insufficient documentation

## 2012-08-12 HISTORY — DX: Bronchitis, not specified as acute or chronic: J40

## 2012-08-12 LAB — POCT PREGNANCY, URINE: Preg Test, Ur: NEGATIVE

## 2012-08-12 MED ORDER — GUAIFENESIN ER 600 MG PO TB12: 1200.0000 mg | ORAL_TABLET | Freq: Two times a day (BID) | ORAL | Status: DC

## 2012-08-12 MED ORDER — CETIRIZINE-PSEUDOEPHEDRINE ER 5-120 MG PO TB12: 1.0000 | ORAL_TABLET | Freq: Two times a day (BID) | ORAL | Status: DC

## 2012-08-12 MED ORDER — ONDANSETRON 4 MG PO TBDP: 8.0000 mg | ORAL_TABLET | Freq: Once | ORAL | Status: DC

## 2012-08-12 MED ORDER — ONDANSETRON 8 MG PO TBDP: ORAL_TABLET | ORAL | Status: DC

## 2012-08-12 NOTE — ED Provider Notes (Signed)
History     CSN: [id number]  Arrival date & time 08/12/12  0021   First MD Initiated Contact with Patient 08/12/12 [phone]      Chief Complaint  Patient presents with  . Nasal Congestion  . Cough   HPI  History provided by the patient. Patient is a 27 year old female with no significant PMH who presents with complaints of continued nasal congestion, rhinorrhea, and cough for the past 3 days. Patient states that she has used Tylenol and ibuprofen as well as a cough syrup without any improvement of symptoms. Patient also reports developing some episodes of nausea with 2 episodes of vomiting yesterday. She states she vomited after trying to take cold medicines. She was able to be in drink. Her symptoms now also associated with some headache and worsening discomfort. Patient denies any other aggravating or alleviating factors. Denies any known sick contacts. No recent travel. She does mention some irregularities with her menstrual cycle. Does not believe she is pregnant but states this is possible. Denies any urinary complaints. No dysuria, hematuria urinary frequency. No vaginal bleeding or discharge. No abdominal pain.    Past Medical History  Diagnosis Date  . Migraine   . Bronchitis     History reviewed. No pertinent past surgical history.  No family history on file.  History  Substance Use Topics  . Smoking status: Current Some Day Smoker  . Smokeless tobacco: Not on file  . Alcohol Use: Yes     Comment: OCCAS- VODKA    OB History   Grav Para Term Preterm Abortions TAB SAB Ect Mult Living   3    3 1 2    0      Review of Systems  Constitutional: Negative for fever, chills and diaphoresis.  HENT: Positive for congestion, sore throat, rhinorrhea and sinus pressure.   Respiratory: Positive for cough.   Gastrointestinal: Positive for vomiting. Negative for abdominal pain and diarrhea.  Genitourinary: Negative for dysuria, frequency, hematuria and flank pain.  All other  systems reviewed and are negative.    Allergies  Review of patient's allergies indicates no known allergies.  Home Medications  No current outpatient prescriptions on file.  BP 121/70  Pulse 91  Temp(Src) 98.3 F (36.8 C) (Oral)  Resp 16  SpO2 95%  Physical Exam  Nursing note and vitals reviewed. Constitutional: She is oriented to person, place, and time. She appears well-developed and well-nourished. No distress.  HENT:  Head: Normocephalic.  Right Ear: Tympanic membrane normal.  Left Ear: Tympanic membrane normal.  Nose: Mucosal edema and rhinorrhea present.  Mouth/Throat: Oropharynx is clear and moist.  Neck: Normal range of motion. Neck supple.  No meningeal signs  Cardiovascular: Normal rate and regular rhythm.   Pulmonary/Chest: Effort normal and breath sounds normal. No respiratory distress. She has no wheezes.  Abdominal: Soft. There is no tenderness.  Musculoskeletal: Normal range of motion.  Neurological: She is alert and oriented to person, place, and time.  Skin: Skin is warm and dry. No rash noted.  Psychiatric: She has a normal mood and affect. Her behavior is normal.    ED Course  Procedures   Results for orders placed during the hospital encounter of 08/12/12  POCT PREGNANCY, URINE      Result Value Range   Preg Test, Ur NEGATIVE  NEGATIVE      1. URI (upper respiratory infection)   2. Nausea & vomiting       MDM  2:25AM patient seen and evaluated.  Patient well-appearing in no acute distress. She does not appear severely ill or toxic. Lungs are clear. Patient afebrile. Symptoms were more consistent with upper respiratory infection.  Patient able to tolerate by mouth fluids. Will provide prescriptions for symptomatic treatment.        Angus Seller, PA-C 08/12/12 [phone]

## 2012-08-12 NOTE — ED Notes (Signed)
PT. REPORTS PERSISTENT NASAL CONGESTION , RUNNY NOSE AND OCCASIONAL DRY COUGH FOR SEVERAL DAYS UNRELIEVED BY OTC MEDICATIONS .

## 2012-08-12 NOTE — ED Provider Notes (Signed)
Medical screening examination/treatment/procedure(s) were performed by non-physician practitioner and as supervising physician I was immediately available for consultation/collaboration.  Flint Melter, MD 08/12/12 [phone]

## 2012-11-01 ENCOUNTER — Emergency Department (HOSPITAL_COMMUNITY): Payer: Self-pay

## 2012-11-01 ENCOUNTER — Emergency Department (HOSPITAL_COMMUNITY)
Admission: EM | Admit: 2012-11-01 | Discharge: 2012-11-01 | Disposition: A | Discharge status: Home / Self Care | Payer: Self-pay | Attending: Emergency Medicine | Admitting: Emergency Medicine

## 2012-11-01 ENCOUNTER — Encounter (HOSPITAL_COMMUNITY): Payer: Self-pay | Admitting: Emergency Medicine

## 2012-11-01 DIAGNOSIS — IMO0001 Reserved for inherently not codable concepts without codable children: Secondary | ICD-10-CM | POA: Insufficient documentation

## 2012-11-01 DIAGNOSIS — B9789 Other viral agents as the cause of diseases classified elsewhere: Secondary | ICD-10-CM | POA: Insufficient documentation

## 2012-11-01 DIAGNOSIS — R110 Nausea: Secondary | ICD-10-CM | POA: Insufficient documentation

## 2012-11-01 DIAGNOSIS — R059 Cough, unspecified: Secondary | ICD-10-CM | POA: Insufficient documentation

## 2012-11-01 DIAGNOSIS — Z8709 Personal history of other diseases of the respiratory system: Secondary | ICD-10-CM | POA: Insufficient documentation

## 2012-11-01 DIAGNOSIS — R05 Cough: Secondary | ICD-10-CM | POA: Insufficient documentation

## 2012-11-01 DIAGNOSIS — Z8679 Personal history of other diseases of the circulatory system: Secondary | ICD-10-CM | POA: Insufficient documentation

## 2012-11-01 DIAGNOSIS — Z87891 Personal history of nicotine dependence: Secondary | ICD-10-CM | POA: Insufficient documentation

## 2012-11-01 DIAGNOSIS — B349 Viral infection, unspecified: Secondary | ICD-10-CM

## 2012-11-01 DIAGNOSIS — R52 Pain, unspecified: Secondary | ICD-10-CM | POA: Insufficient documentation

## 2012-11-01 DIAGNOSIS — H9209 Otalgia, unspecified ear: Secondary | ICD-10-CM | POA: Insufficient documentation

## 2012-11-01 NOTE — ED Notes (Signed)
Pt reports sore throat and intermittent aches to arms and legs. Pt has tried mucinex, chloroseptic spray all without relief. Pt has tried ibuprofen with some relief.

## 2012-11-01 NOTE — ED Provider Notes (Signed)
This chart was scribed for Vinetta Bergamo, a non-physician practitioner working with No att. providers found by Lewanda Rife, ED Scribe. This patient was seen in room TR11C/TR11C and the patient's care was started at 1703.    CSN: [id number]     Arrival date & time 11/01/12  1358 History     First MD Initiated Contact with Patient 11/01/12 1513     Chief Complaint  Patient presents with  . Sore Throat  . Generalized Body Aches   (Consider location/radiation/quality/duration/timing/severity/associated sxs/prior Treatment) The history is provided by the patient.   HPI Comments: NAYELIS BONITO is a 27 y.o. female who presents to the Emergency Department complaining of constant moderate sore throat onset 3 days. Reports associated intermittent generalized myalgias of arms and legs, non-productive cough, right ear otalgia, weakness, difficulty sleeping, pain with swallowing, and nausea. Denies associated fever, neck pain, neck stiffness, chills, back pain, emesis, diarrhea, blurred vision, black tarry stools, paresthesias, chest pain, allergies, rhinorrhea, and difficulty swallowing. Reports pain is aggravated when swallowing and alleviated with hot fluids. Denies sick contacts. Reports trying Mucinex, OTC 800 mg ibuprofen, and chloroseptic spray with mild relief of symptoms.   Past Medical History  Diagnosis Date  . Migraine   . Bronchitis    History reviewed. No pertinent past surgical history. No family history on file. History  Substance Use Topics  . Smoking status: Former Smoker    Quit date: 06/01/2012  . Smokeless tobacco: Not on file  . Alcohol Use: Yes     Comment: OCCAS- VODKA   OB History   Grav Para Term Preterm Abortions TAB SAB Ect Mult Living   3    3 1 2    0     Review of Systems  Constitutional: Negative for fever.  HENT: Positive for ear pain and sore throat. Negative for rhinorrhea, trouble swallowing, neck pain and neck stiffness.   Respiratory:  Positive for cough. Negative for shortness of breath.   Cardiovascular: Negative for chest pain.  Gastrointestinal: Positive for nausea. Negative for vomiting and diarrhea.  Musculoskeletal: Positive for myalgias.  Allergic/Immunologic: Negative for environmental allergies.  Psychiatric/Behavioral: Negative for confusion.   A complete 10 system review of systems was obtained and all systems are negative except as noted in the HPI and PMH.    Allergies  Review of patient's allergies indicates no known allergies.  Home Medications  No current outpatient prescriptions on file. BP 104/66  Pulse 80  Temp(Src) 98.2 F (36.8 C) (Oral)  Resp 16  Ht 5\' 4"  (1.626 m)  Wt 240 lb (108.863 kg)  BMI 41.18 kg/m2  SpO2 99%  LMP 10/12/2012 Physical Exam  Nursing note and vitals reviewed. Constitutional: She is oriented to person, place, and time. She appears well-developed and well-nourished. No distress.  HENT:  Head: Normocephalic and atraumatic.  Mouth/Throat: No oropharyngeal exudate.  Mild erythema noted to the posterior oropharynx. Negative swelling to the tonsils bilaterally. Negative exudate noted. Negative petechiae to the soft palate. Uvula midline with symmetrical elevation. Negative signs of peritonsillar abscess.  Eyes: Conjunctivae and EOM are normal. Pupils are equal, round, and reactive to light. Right eye exhibits no discharge. Left eye exhibits no discharge.  Negative discharge noted to the eyes bilaterally  Neck: Normal range of motion. Neck supple. No tracheal deviation present.  Negative neck stiffness Negative nuchal rigidity Negative lymphadenopathy  Negative pain upon palpation to the cervical spine   Cardiovascular: Normal rate, regular rhythm and normal heart sounds.  Exam  reveals no friction rub.   No murmur heard. Pulses:      Radial pulses are 2+ on the right side, and 2+ on the left side.       Dorsalis pedis pulses are 2+ on the right side, and 2+ on the left  side.  Pulmonary/Chest: Effort normal. No respiratory distress.  Musculoskeletal: Normal range of motion. She exhibits no tenderness.  Full range of motion to upper and lower extremities bilaterally  Lymphadenopathy:    She has no cervical adenopathy.  Neurological: She is alert and oriented to person, place, and time. She exhibits normal muscle tone. Coordination normal.  Strength 5+/5+ to upper and lower extremities bilaterally. Gait proper--negative sway.  Skin: Skin is warm and dry.  Psychiatric: She has a normal mood and affect. Her behavior is normal.    ED Course   Procedures (including critical care time) Medications - No data to display  Labs Reviewed  RAPID STREP SCREEN  CULTURE, GROUP A STREP   Dg Chest 2 View  11/01/2012   *RADIOLOGY REPORT*  Clinical Data: Sore throat with chest pain and difficulty breathing  CHEST - 2 VIEW  Comparison: Jul 24, 2008.  Findings: Lungs clear.  Heart size and pulmonary vascularity are normal.  No adenopathy.  No bone lesions.  No pneumothorax.  There are several small radiopaque foreign bodies either enter overlying the upper right hemithorax.  IMPRESSION: Several small radiopaque foreign bodies either in or overlying the upper right hemithorax.  Lungs clear.  No pneumothorax.   Original Report Authenticated By: Bretta Bang, M.D.   1. Viral illness     MDM  I personally performed the services described in this documentation, which was scribed in my presence. The recorded information has been reviewed and is accurate.  Patient presenting to the emergency department with sore throat, cough, generalized bodyaches has been ongoing since Friday. Alert and oriented. Mild erythema noted to the posterior oropharynx-negative swelling, negative CK, negative exudate. Uvula midline-negative signs of Ludwig's angina, negative signs of peritonsillar abscess. Negative pain upon palpation to neck, negative cervical lymphadenopathy noted. Lungs clear to  auscultation. Full range of motion to upper and lower extremities noted without discomfort. Strength intact bilaterally. Sensation intact bilaterally. Patient stable, afebrile. Suspicion to be viral illness in nature. Discussed with patient to continue to rest and stay hydrated. Discussed with patient to avoid admission if activity. Discussed with patient supportive therapy for viral illnesses. Discussed with patient to continue to monitor symptoms and if symptoms are to worsen or change to report back to the emergency department - strict return instructions given. Patient agreed to plan of care, understood, all questions answered.   Raymon Mutton, PA-C 11/02/12 [phone]

## 2012-11-03 LAB — CULTURE, GROUP A STREP

## 2012-11-05 NOTE — ED Provider Notes (Signed)
Medical screening examination/treatment/procedure(s) were performed by non-physician practitioner and as supervising physician I was immediately available for consultation/collaboration.   Adine Heimann E Muhammed Teutsch, MD 11/05/12 1527 

## 2012-11-26 ENCOUNTER — Emergency Department (HOSPITAL_COMMUNITY)
Admission: EM | Admit: 2012-11-26 | Discharge: 2012-11-27 | Disposition: A | Discharge status: Home / Self Care | Payer: Worker's Compensation | Attending: Emergency Medicine | Admitting: Emergency Medicine

## 2012-11-26 DIAGNOSIS — Z8709 Personal history of other diseases of the respiratory system: Secondary | ICD-10-CM | POA: Insufficient documentation

## 2012-11-26 DIAGNOSIS — Z3202 Encounter for pregnancy test, result negative: Secondary | ICD-10-CM | POA: Insufficient documentation

## 2012-11-26 DIAGNOSIS — Z87891 Personal history of nicotine dependence: Secondary | ICD-10-CM | POA: Insufficient documentation

## 2012-11-26 DIAGNOSIS — R112 Nausea with vomiting, unspecified: Secondary | ICD-10-CM | POA: Insufficient documentation

## 2012-11-26 DIAGNOSIS — Z8679 Personal history of other diseases of the circulatory system: Secondary | ICD-10-CM | POA: Insufficient documentation

## 2012-11-26 LAB — CBC WITH DIFFERENTIAL/PLATELET
Basophils Absolute: 0 10*3/uL (ref 0.0–0.1)
Basophils Relative: 1 % (ref 0–1)
Eosinophils Absolute: 0.6 10*3/uL (ref 0.0–0.7)
Eosinophils Relative: 7 % — ABNORMAL HIGH (ref 0–5)
Lymphs Abs: 2.9 10*3/uL (ref 0.7–4.0)
MCH: 28.7 pg (ref 26.0–34.0)
MCHC: 34.9 g/dL (ref 30.0–36.0)
Neutrophils Relative %: 42 % — ABNORMAL LOW (ref 43–77)
Platelets: 374 10*3/uL (ref 150–400)
RBC: 4.57 MIL/uL (ref 3.87–5.11)
RDW: 15.3 % (ref 11.5–15.5)

## 2012-11-26 LAB — BASIC METABOLIC PANEL
BUN: 9 mg/dL (ref 6–23)
Calcium: 8.9 mg/dL (ref 8.4–10.5)
Creatinine, Ser: 0.78 mg/dL (ref 0.50–1.10)
GFR calc Af Amer: >90 mL/min (ref >90)
GFR calc non Af Amer: >90 mL/min (ref >90)

## 2012-11-26 LAB — POCT PREGNANCY, URINE: Preg Test, Ur: NEGATIVE

## 2012-11-26 MED ORDER — SUCRALFATE 1 G PO TABS
1.0000 g | ORAL_TABLET | Freq: Once | ORAL | Status: DC
  Filled 2012-11-26: qty 1

## 2012-11-26 MED ORDER — ONDANSETRON 4 MG PO TBDP
8.0000 mg | ORAL_TABLET | Freq: Once | ORAL | Status: DC
  Filled 2012-11-26: qty 2

## 2012-11-26 MED ORDER — FAMOTIDINE 20 MG PO TABS: 20.0000 mg | ORAL_TABLET | Freq: Two times a day (BID) | ORAL | Status: DC

## 2012-11-26 NOTE — ED Notes (Signed)
Presents with 2 weeks of emesis after eating. Today pt reports that she noticed some speck of blood in emesis. C/o abdominal pain in lower abdomen. Denies vaginal d/c. Last period was not normal 11/07/12

## 2012-11-27 ENCOUNTER — Encounter: Payer: Self-pay | Admitting: Internal Medicine

## 2012-11-27 MED ORDER — SUCRALFATE 1 G PO TABS: 1.0000 g | ORAL_TABLET | Freq: Four times a day (QID) | ORAL | Status: DC

## 2012-11-27 MED ORDER — FAMOTIDINE 20 MG PO TABS: 20.0000 mg | ORAL_TABLET | Freq: Two times a day (BID) | ORAL | Status: DC

## 2012-11-27 MED ORDER — ONDANSETRON 8 MG PO TBDP: 8.0000 mg | ORAL_TABLET | Freq: Three times a day (TID) | ORAL | Status: DC | PRN

## 2012-11-27 NOTE — ED Provider Notes (Signed)
CSN: [id number]     Arrival date & time 11/26/12  1953 History   First MD Initiated Contact with Patient 11/26/12 2340     Chief Complaint  Patient presents with  . Emesis   (Consider location/radiation/quality/duration/timing/severity/associated sxs/prior Treatment) HPI 27 year old female presents to emergency department with complaint of vomiting after her evening meal ongoing for the last 2 weeks.  Today she vomited throughout the day at work, reports seeing some streaks of blood in her emesis.  She reports work sent her to the emergency department for evaluation.  She denies any abdominal pain.  No fever no chills.  No diarrhea.  No sick contacts.  No history of GERD or ulcers.  She reports she feels well prior to eating her dinner, and then vomits the entire meal immediately afterwards.  She does not have primary care Dr.  She does not smoke.  Occasionally drinks, but not in the last 2 weeks. Past Medical History  Diagnosis Date  . Migraine   . Bronchitis    No past surgical history on file. No family history on file. History  Substance Use Topics  . Smoking status: Former Smoker    Quit date: 06/01/2012  . Smokeless tobacco: Not on file  . Alcohol Use: Yes     Comment: OCCAS- VODKA   OB History   Grav Para Term Preterm Abortions TAB SAB Ect Mult Living   3    3 1 2    0     Review of Systems  All other systems reviewed and are negative.   other than listed in history of present illness  Allergies  Review of patient's allergies indicates no known allergies.  Home Medications  No current outpatient prescriptions on file. BP 125/66  Pulse 79  Temp(Src) 98.1 F (36.7 C) (Oral)  Resp 16  Wt 240 lb 2 oz (108.92 kg)  BMI 41.2 kg/m2  SpO2 100%  LMP 10/12/2012 Physical Exam  Nursing note and vitals reviewed. Constitutional: She is oriented to person, place, and time. She appears well-developed and well-nourished. No distress.  Patient is sitting comfortably in bed  chewing gum  HENT:  Head: Normocephalic and atraumatic.  Nose: Nose normal.  Mouth/Throat: Oropharynx is clear and moist.  Eyes: Conjunctivae and EOM are normal. Pupils are equal, round, and reactive to light.  Neck: Normal range of motion. Neck supple. No JVD present. No tracheal deviation present. No thyromegaly present.  Cardiovascular: Normal rate, regular rhythm, normal heart sounds and intact distal pulses.  Exam reveals no gallop and no friction rub.   No murmur heard. Pulmonary/Chest: Effort normal and breath sounds normal. No stridor. No respiratory distress. She has no wheezes. She has no rales. She exhibits no tenderness.  Abdominal: Soft. Bowel sounds are normal. She exhibits no distension and no mass. There is no tenderness. There is no rebound and no guarding.  Musculoskeletal: Normal range of motion. She exhibits no edema and no tenderness.  Lymphadenopathy:    She has no cervical adenopathy.  Neurological: She is alert and oriented to person, place, and time. She exhibits normal muscle tone. Coordination normal.  Skin: Skin is warm and dry. No rash noted. No erythema. No pallor.  Psychiatric: She has a normal mood and affect. Her behavior is normal. Judgment and thought content normal.    ED Course  Procedures (including critical care time) Labs Review Labs Reviewed  CBC WITH DIFFERENTIAL - Abnormal; Notable for the following:    Neutrophils Relative % 42 (*)  Eosinophils Relative 7 (*)    All other components within normal limits  BASIC METABOLIC PANEL  POCT PREGNANCY, URINE   Imaging Review No results found.  MDM   1. Nausea and vomiting    27 year old female with reported emesis after dinnerX2 weeks with vomiting throughout the day today.  Specks of blood that she is seeing may be from a Mallory-Weiss tear.  She is in no distress.  Her labs are unremarkable.  We'll start her on Pepcid, Carafate, and Zofran for nausea and vomiting.  We'll refer her to  gastroenterology for further evaluation.  Patient advised to stick to a bland diet.    Olivia Mackie, MD 11/27/12 0010

## 2012-12-23 ENCOUNTER — Encounter: Payer: Self-pay | Admitting: Internal Medicine

## 2012-12-29 ENCOUNTER — Other Ambulatory Visit (INDEPENDENT_AMBULATORY_CARE_PROVIDER_SITE_OTHER): Payer: Managed Care, Other (non HMO)

## 2012-12-29 ENCOUNTER — Encounter: Payer: Self-pay | Admitting: Internal Medicine

## 2012-12-29 ENCOUNTER — Ambulatory Visit (INDEPENDENT_AMBULATORY_CARE_PROVIDER_SITE_OTHER): Payer: Managed Care, Other (non HMO) | Admitting: Internal Medicine

## 2012-12-29 VITALS — BP 90/60 | HR 72 | Ht 64.0 in | Wt 241.8 lb

## 2012-12-29 DIAGNOSIS — Z800 Family history of malignant neoplasm of digestive organs: Secondary | ICD-10-CM | POA: Insufficient documentation

## 2012-12-29 DIAGNOSIS — R198 Other specified symptoms and signs involving the digestive system and abdomen: Secondary | ICD-10-CM

## 2012-12-29 DIAGNOSIS — K219 Gastro-esophageal reflux disease without esophagitis: Secondary | ICD-10-CM | POA: Insufficient documentation

## 2012-12-29 DIAGNOSIS — R112 Nausea with vomiting, unspecified: Secondary | ICD-10-CM

## 2012-12-29 DIAGNOSIS — K920 Hematemesis: Secondary | ICD-10-CM

## 2012-12-29 DIAGNOSIS — R194 Change in bowel habit: Secondary | ICD-10-CM

## 2012-12-29 DIAGNOSIS — Z8669 Personal history of other diseases of the nervous system and sense organs: Secondary | ICD-10-CM | POA: Insufficient documentation

## 2012-12-29 LAB — COMPREHENSIVE METABOLIC PANEL
ALT: 21 U/L (ref 0–35)
AST: 19 U/L (ref 0–37)
Albumin: 3.7 g/dL (ref 3.5–5.2)
Alkaline Phosphatase: 55 U/L (ref 39–117)
BUN: 12 mg/dL (ref 6–23)
Chloride: 109 mEq/L (ref 96–112)
Potassium: 4.1 mEq/L (ref 3.5–5.1)
Sodium: 140 mEq/L (ref 135–145)

## 2012-12-29 LAB — CBC
MCHC: 33.8 g/dL (ref 30.0–36.0)
MCV: 82.7 fl (ref 78.0–100.0)
Platelets: 380 10*3/uL (ref 150.0–400.0)
RDW: 15.5 % — ABNORMAL HIGH (ref 11.5–14.6)

## 2012-12-29 MED ORDER — OMEPRAZOLE 40 MG PO CPDR: 40.0000 mg | DELAYED_RELEASE_CAPSULE | Freq: Every day | ORAL | Status: DC

## 2012-12-29 MED ORDER — ONDANSETRON 4 MG PO TBDP: 4.0000 mg | ORAL_TABLET | Freq: Three times a day (TID) | ORAL | Status: DC | PRN

## 2012-12-29 MED ORDER — MOVIPREP 100 G PO SOLR: ORAL | Status: DC

## 2012-12-29 MED ORDER — HYOSCYAMINE SULFATE 0.125 MG SL SUBL: 0.1250 mg | SUBLINGUAL_TABLET | SUBLINGUAL | Status: DC | PRN

## 2012-12-29 NOTE — Progress Notes (Signed)
Patient ID: Diana Serrano, female   DOB: [birth year], 27 y.o.   MRN: [id number] HPI: Diana Serrano is a 27 yo female with PMH of migraines who is seen to evaluate epigastric pain, nausea with vomiting, an isolated episode of hematemesis and September 2014 and lower abdominal cramping pain with change in bowel habits. She is here alone today. The patient reports over the last several months having developed intermittent nausea with vomiting. This seems to come and go. She's noted a significant decrease in her appetite. She also endorses more frequent heartburn and early satiety. Heartburn can wake her from sleep and most recently has been "intolerable". The nausea is not necessarily daily but occurring multiple days per week. The vomiting seems to be worse depending on what she eats. She says [place] food particularly makes her nauseous. In September she noted bright red blood in her emesis but this has not recurred with any subsequent vomiting episodes. She also notes a change in her bowel habits and having developed loose watery stools. She reports this is occurring somewhat spontaneously each time she urinates. This is occurring 5-6 times per day. Previously her stools were more formed without incontinence episodes. Her loose watery stools have been ongoing for about 10 days. She denies blood in her stool or melena. He also reports lower abdominal, and bilateral cramping tight abdominal pain. This feels different from her regular menstrual cramping. She is not currently taking any medications. She does report she essentially active and actively trying to become pregnant. Her last period was in mid September and was noted to be shorter than normal for her. This menstrual cycle lasted 3 days with normal or her being 5-7.  Of note she reports a significant family history of colorectal cancer in her mother. Her mother died at age 28 of colorectal cancer.  Past Medical History  Diagnosis Date  . Migraine   .  Bronchitis     History reviewed. No pertinent past surgical history.  Current Outpatient Prescriptions  Medication Sig Dispense Refill  . hyoscyamine (LEVSIN SL) 0.125 MG SL tablet Place 1 tablet (0.125 mg total) under the tongue every 4 (four) hours as needed for cramping.  30 tablet  0  . MOVIPREP 100 G SOLR Use per prep instruction  1 kit  0  . omeprazole (PRILOSEC) 40 MG capsule Take 1 capsule (40 mg total) by mouth daily.  90 capsule  6  . ondansetron (ZOFRAN ODT) 4 MG disintegrating tablet Take 1 tablet (4 mg total) by mouth every 8 (eight) hours as needed for nausea.  20 tablet  3   No current facility-administered medications for this visit.    No Known Allergies  Family History  Problem Relation Age of Onset  . Colon cancer Mother   . Colon polyps Mother   . Diabetes Paternal Grandfather     History  Substance Use Topics  . Smoking status: Former Smoker    Quit date: 06/01/2012  . Smokeless tobacco: Never Used  . Alcohol Use: Yes     Comment: OCCAS- VODKA    ROS: As per history of present illness, otherwise negative  BP 90/60  Pulse 72  Ht 5\' 4"  (1.626 m)  Wt 241 lb 12.8 oz (109.68 kg)  BMI 41.48 kg/m2  LMP 11/30/2012 Constitutional: Well-developed and well-nourished. No distress. HEENT: Normocephalic and atraumatic. Oropharynx is clear and moist. No oropharyngeal exudate. Conjunctivae are normal.  No scleral icterus. Neck: Neck supple. Trachea midline. Cardiovascular: Normal rate, regular rhythm and  intact distal pulses. No M/R/G Pulmonary/chest: Effort normal and breath sounds normal. No wheezing, rales or rhonchi. Abdominal: Soft, epigastric and lower abdominal tenderness without rebound or guarding, nondistended. Bowel sounds active throughout.  Extremities: no clubbing, cyanosis, or edema Lymphadenopathy: No cervical adenopathy noted. Neurological: Alert and oriented to person place and time. Skin: Skin is warm and dry. No rashes noted. Psychiatric:  Normal mood and affect. Behavior is normal.  RELEVANT LABS AND IMAGING: CBC    Component Value Date/Time   WBC 7.8 11/26/2012 2005   RBC 4.57 11/26/2012 2005   HGB 13.1 11/26/2012 2005   HCT 37.5 11/26/2012 2005   PLT 374 11/26/2012 2005   MCV 82.1 11/26/2012 2005   MCH 28.7 11/26/2012 2005   MCHC 34.9 11/26/2012 2005   RDW 15.3 11/26/2012 2005   LYMPHSABS 2.9 11/26/2012 2005   MONOABS 1.0 11/26/2012 2005   EOSABS 0.6 11/26/2012 2005   BASOSABS 0.0 11/26/2012 2005    CMP     Component Value Date/Time   NA 137 11/26/2012 2005   K 3.8 11/26/2012 2005   CL 105 11/26/2012 2005   CO2 23 11/26/2012 2005   GLUCOSE 89 11/26/2012 2005   BUN 9 11/26/2012 2005   CREATININE 0.78 11/26/2012 2005   CALCIUM 8.9 11/26/2012 2005   PROT 7.4 07/25/2008 1315   ALBUMIN 3.9 07/25/2008 1315   AST 28 07/25/2008 1315   ALT 17 07/25/2008 1315   ALKPHOS 59 07/25/2008 1315   BILITOT 0.5 07/25/2008 1315   GFRNONAA >90 11/26/2012 2005   GFRAA >90 11/26/2012 2005    ASSESSMENT/PLAN: 27 yo female with PMH of migraines who is seen to evaluate epigastric pain, nausea with vomiting, an isolated episode of hematemesis and September 2014 and lower abdominal cramping pain with change in bowel habits.   1.  Epigastric pain/GERD/N/V -- first and foremost I want to ensure that she is not pregnant. We will perform urine pregnancy test today. If she is not pregnant, given her epigastric pain, heartburn, nausea with vomiting, I will start her on omeprazole 40 mg once daily. She is instructed to take this 30 minutes to one hour before her first meal of the day. I have recommended upper endoscopy for direct visualization to exclude PUD and H. pylori. We discussed the test today including the risks and benefits and she is agreeable to proceed. I will give her prescription for Zofran ODT to be used as needed as directed for nausea.  2.  Change in bowel habits/family hx of colon cancer in first-degree relative -- again we will ensure she is not  pregnant. If not, I will perform stool studies to exclude infection. Assuming this is negative, we will proceed to colonoscopy to evaluate her change in bowel habits, fecal incontinence, and given the fact that her mother had colorectal cancer at such currently age. She is worried about this possibility, and I have reassured her that my suspicion for colorectal cancer at this point is very low. We discussed colonoscopy including the risks and benefits and she is agreeable to proceed.  Other labs to be performed today to include CMP, CBC, celiac panel and TSH

## 2012-12-29 NOTE — Patient Instructions (Signed)
You have been scheduled for a colonoscopy/Endoscopy with propofol. Please follow written instructions given to you at your visit today.  Please pick up your prep kit at the pharmacy within the next 1-3 days. If you use inhalers (even only as needed), please bring them with you on the day of your procedure. Your physician has requested that you go to www.startemmi.com and enter the access code given to you at your visit today. This web site gives a general overview about your procedure. However, you should still follow specific instructions given to you by our office regarding your preparation for the procedure.  We have sent the following medications to your pharmacy for you to pick up at your convenience: Levsin, Zofran, Omeprazole, Moviprep  Your physician has requested that you go to the basement for lab work before leaving today. Blood and stool studies                                               We are excited to introduce MyChart, a new best-in-class service that provides you online access to important information in your electronic medical record. We want to make it easier for you to view your health information - all in one secure location - when and where you need it. We expect MyChart will enhance the quality of care and service we provide.  When you register for MyChart, you can:    View your test results.    Request appointments and receive appointment reminders via email.    Request medication renewals.    View your medical history, allergies, medications and immunizations.    Communicate with your physician's office through a password-protected site.    Conveniently print information such as your medication lists.  To find out if MyChart is right for you, please talk to a member of our clinical staff today. We will gladly answer your questions about this free health and wellness tool.  If you are age 26 or older and want a member of your family to have access to your  record, you must provide written consent by completing a proxy form available at our office. Please speak to our clinical staff about guidelines regarding accounts for patients younger than age 8.  As you activate your MyChart account and need any technical assistance, please call the MyChart technical support line at [phone]) or email your question to [email or web address]. If you email your question(s), please include your name, a return phone number and the best time to reach you.  If you have non-urgent health-related questions, you can send a message to our office through MyChart at [email or web address]. If you have a medical emergency, call 911.  Thank you for using MyChart as your new health and wellness resource!   MyChart licensed from [employer],  [id number]. Patents Pending.

## 2012-12-30 LAB — TISSUE TRANSGLUTAMINASE, IGA: Tissue Transglutaminase Ab, IgA: 5.4 U/mL (ref <20)

## 2013-01-26 ENCOUNTER — Encounter: Payer: Self-pay | Admitting: Internal Medicine

## 2013-02-02 ENCOUNTER — Encounter: Payer: Self-pay | Admitting: Internal Medicine

## 2013-02-02 ENCOUNTER — Ambulatory Visit (AMBULATORY_SURGERY_CENTER): Payer: Managed Care, Other (non HMO) | Admitting: Internal Medicine

## 2013-02-02 VITALS — BP 110/67 | HR 74 | Temp 97.1°F | Resp 21 | Ht 64.0 in | Wt 241.0 lb

## 2013-02-02 DIAGNOSIS — K920 Hematemesis: Secondary | ICD-10-CM

## 2013-02-02 DIAGNOSIS — R194 Change in bowel habit: Secondary | ICD-10-CM

## 2013-02-02 DIAGNOSIS — R198 Other specified symptoms and signs involving the digestive system and abdomen: Secondary | ICD-10-CM

## 2013-02-02 DIAGNOSIS — K219 Gastro-esophageal reflux disease without esophagitis: Secondary | ICD-10-CM

## 2013-02-02 DIAGNOSIS — Z538 Procedure and treatment not carried out for other reasons: Secondary | ICD-10-CM

## 2013-02-02 DIAGNOSIS — R112 Nausea with vomiting, unspecified: Secondary | ICD-10-CM

## 2013-02-02 DIAGNOSIS — R1013 Epigastric pain: Secondary | ICD-10-CM

## 2013-02-02 DIAGNOSIS — Z800 Family history of malignant neoplasm of digestive organs: Secondary | ICD-10-CM

## 2013-02-02 MED ORDER — SODIUM CHLORIDE 0.9 % IV SOLN: 500.0000 mL | INTRAVENOUS | Status: DC

## 2013-02-02 MED ORDER — OMEPRAZOLE 40 MG PO CPDR: 40.0000 mg | DELAYED_RELEASE_CAPSULE | Freq: Every day | ORAL | Status: DC

## 2013-02-02 NOTE — Patient Instructions (Signed)
YOU HAD AN ENDOSCOPIC PROCEDURE TODAY AT [facility]: Refer to the procedure report that was given to you for any specific questions about what was found during the examination.  If the procedure report does not answer your questions, please call your gastroenterologist to clarify.  If you requested that your care partner not be given the details of your procedure findings, then the procedure report has been included in a sealed envelope for you to review at your convenience later.  YOU SHOULD EXPECT: Some feelings of bloating in the abdomen. Passage of more gas than usual.  Walking can help get rid of the air that was put into your GI tract during the procedure and reduce the bloating. If you had a lower endoscopy (such as a colonoscopy or flexible sigmoidoscopy) you may notice spotting of blood in your stool or on the toilet paper. If you underwent a bowel prep for your procedure, then you may not have a normal bowel movement for a few days.  DIET: Your first meal following the procedure should be a light meal and then it is ok to progress to your normal diet.  A half-sandwich or bowl of soup is an example of a good first meal.  Heavy or fried foods are harder to digest and may make you feel nauseous or bloated.  Likewise meals heavy in dairy and vegetables can cause extra gas to form and this can also increase the bloating.  Drink plenty of fluids but you should avoid alcoholic beverages for 24 hours.  ACTIVITY: Your care partner should take you home directly after the procedure.  You should plan to take it easy, moving slowly for the rest of the day.  You can resume normal activity the day after the procedure however you should NOT DRIVE or use heavy machinery for 24 hours (because of the sedation medicines used during the test).    SYMPTOMS TO REPORT IMMEDIATELY: A gastroenterologist can be reached at any hour.  During normal business hours, 8:30 AM to 5:00 PM Monday through Friday,  call [phone].  After hours and on weekends, please call the GI answering service at [phone]  Emergency number who will take a message and have the physician on call contact you.   Following lower endoscopy (colonoscopy or flexible sigmoidoscopy):  Excessive amounts of blood in the stool  Significant tenderness or worsening of abdominal pains  Swelling of the abdomen that is new, acute  Fever of 100F or higher  Following upper endoscopy (EGD)  Vomiting of blood or coffee ground material  New chest pain or pain under the shoulder blades  Painful or persistently difficult swallowing  New shortness of breath  Fever of 100F or higher  Black, tarry-looking stools  FOLLOW UP: If any biopsies were taken you will be contacted by phone or by letter within the next 1-3 weeks.  Call your gastroenterologist if you have not heard about the biopsies in 3 weeks.  Our staff will call the home number listed on your records the next business day following your procedure to check on you and address any questions or concerns that you may have at that time regarding the information given to you following your procedure. This is a courtesy call and so if there is no answer at the home number and we have not heard from you through the emergency physician on call, we will assume that you have returned to your regular daily activities without incident.  SIGNATURES/CONFIDENTIALITY: You  and/or your care partner have signed paperwork which will be entered into your electronic medical record.  These signatures attest to the fact that that the information above on your After Visit Summary has been reviewed and is understood.  Full responsibility of the confidentiality of this discharge information lies with you and/or your care-partner. . Repeat colon with a 2 day prep see appointments  Continue omeprazole 40 mg once ad ay. This is best taken 20-30 minutes before the first meal of the day

## 2013-02-02 NOTE — Progress Notes (Signed)
Pt's phone was given to her care partner - Clyda Greener friend in the waiting area.  maw

## 2013-02-02 NOTE — Progress Notes (Signed)
Called to room to assist during endoscopic procedure.  Patient ID and intended procedure confirmed with present staff. Received instructions for my participation in the procedure from the performing physician.  

## 2013-02-02 NOTE — Progress Notes (Signed)
No complaints noted in the recovery room. Maw  Pt given a work note. Maw  I explained to the pt dates for her pre-visit and colonoscopy.  She states she understands.  maw

## 2013-02-02 NOTE — Op Note (Signed)
[facility] [address] [place], [address]   COLONOSCOPY PROCEDURE REPORT  PATIENT: Diana, Serrano  MR#: [id number] BIRTHDATE: [birth year] , 27  yrs. old GENDER: Female ENDOSCOPIST: Beverley Fiedler, MD PROCEDURE DATE:  02/02/2013 PROCEDURE:   Colonoscopy, incomplete First Screening Colonoscopy - Avg.  risk and is 50 yrs.  old or older - No.  Prior Negative Screening - Now for repeat screening. N/A  History of Adenoma - Now for follow-up colonoscopy & has been > or = to 3 yrs.  N/A  Polyps Removed Today? No.  Recommend repeat exam, <10 yrs? Yes.  Inadequate prep. ASA CLASS:   Class II INDICATIONS:Change in bowel habits and Patient's immediate family history of colon cancer. MEDICATIONS: MAC sedation, administered by CRNA and propofol (Diprivan) 50mg  IV  DESCRIPTION OF PROCEDURE:   After the risks benefits and alternatives of the procedure were thoroughly explained, informed consent was obtained.  A digital rectal exam revealed no rectal mass.   The LB [id number]  endoscope was introduced through the anus and advanced to the splenic flexure. No adverse events experienced.   The quality of the prep was poor, using MoviPrep The instrument was then slowly withdrawn as the colon was fully examined.   COLON FINDINGS: A significant amount of solid stool was present in the left colon and rectum which prevented adequate/complete visualization of the colonic mucosa. The procedure was aborted due to poor preparation.  Retroflexed views revealed no abnormalities. The scope was withdrawn and the procedure completed.  COMPLICATIONS: There were no complications.  ENDOSCOPIC IMPRESSION: Significant amount of stool was present in the left colon and rectum preventing visualization/Poor prep  RECOMMENDATIONS: Repeat colonoscopy with 2 day bowel preparation (schedule pre-visit today)   eSigned:  Beverley Fiedler, MD 02/02/2013 11:46 AM   cc: The Patient

## 2013-02-02 NOTE — Op Note (Signed)
 [facility] [address] [place], [address]   ENDOSCOPY PROCEDURE REPORT  PATIENT: Diana Serrano, Diana Serrano  MR#: [id number] BIRTHDATE: Aug 06, 1985 , 27  yrs. old GENDER: Female ENDOSCOPIST: Beverley Fiedler, MD PROCEDURE DATE:  02/02/2013 PROCEDURE: ASA CLASS:     Class II INDICATIONS:  Heartburn.   Epigastric pain.   Nausea.   Vomiting. MEDICATIONS: MAC sedation, administered by CRNA and propofol (Diprivan) 250mg  IV TOPICAL ANESTHETIC: Cetacaine Spray  DESCRIPTION OF PROCEDURE: After the risks benefits and alternatives of the procedure were thoroughly explained, informed consent was obtained.  The LB [id number] endoscope was introduced through the mouth and advanced to the second portion of the duodenum. Without limitations.  The instrument was slowly withdrawn as the mucosa was fully examined.     ESOPHAGUS: The mucosa of the esophagus appeared normal.   A variable Z-line was observed 38 cm from the incisors with one small nodule at the Z-line.  A single biopsy of this small nodule was performed using cold forceps.  STOMACH: The mucosa of the stomach appeared normal.  Biopsies were taken in the antrum and angularis.  DUODENUM: The duodenal mucosa showed no abnormalities in the bulb and second portion of the duodenum.  Retroflexed views revealed no abnormalities.     The scope was then withdrawn from the patient and the procedure completed.  COMPLICATIONS: There were no complications. ENDOSCOPIC IMPRESSION: 1.   The mucosa of the esophagus appeared normal 2.   Variable Z-line was observed 38 cm from the incisors; multiple biopsies 3.   The mucosa of the stomach appeared normal; biopsies were taken in the antrum and angularis 4.   The duodenal mucosa showed no abnormalities in the bulb and second portion of the duodenum  RECOMMENDATIONS: 1.  Await pathology results 2.  Continue omeprazole 40 mg daily 3.  Follow-up of helicobacter pylori status,  treat if indicated  eSigned:  Beverley Fiedler, MD 02/02/2013 11:42 AM  CC:The Patient

## 2013-02-03 ENCOUNTER — Telehealth: Payer: Self-pay | Admitting: *Deleted

## 2013-02-03 NOTE — Telephone Encounter (Signed)
Message left

## 2013-02-04 ENCOUNTER — Ambulatory Visit (AMBULATORY_SURGERY_CENTER): Payer: Managed Care, Other (non HMO)

## 2013-02-04 VITALS — Ht 64.0 in | Wt 240.0 lb

## 2013-02-04 DIAGNOSIS — Z800 Family history of malignant neoplasm of digestive organs: Secondary | ICD-10-CM

## 2013-02-04 MED ORDER — MOVIPREP 100 G PO SOLR: 1.0000 | Freq: Once | ORAL | Status: DC

## 2013-02-08 ENCOUNTER — Encounter: Payer: Self-pay | Admitting: Internal Medicine

## 2013-02-10 ENCOUNTER — Encounter: Payer: Self-pay | Admitting: Internal Medicine

## 2013-02-22 ENCOUNTER — Encounter: Payer: Self-pay | Admitting: Internal Medicine

## 2013-02-22 ENCOUNTER — Ambulatory Visit (AMBULATORY_SURGERY_CENTER): Payer: Managed Care, Other (non HMO) | Admitting: Internal Medicine

## 2013-02-22 VITALS — BP 97/66 | HR 75 | Temp 96.8°F | Resp 19 | Ht 64.0 in | Wt 240.0 lb

## 2013-02-22 DIAGNOSIS — D126 Benign neoplasm of colon, unspecified: Secondary | ICD-10-CM

## 2013-02-22 DIAGNOSIS — R198 Other specified symptoms and signs involving the digestive system and abdomen: Secondary | ICD-10-CM

## 2013-02-22 DIAGNOSIS — Z800 Family history of malignant neoplasm of digestive organs: Secondary | ICD-10-CM

## 2013-02-22 MED ORDER — SODIUM CHLORIDE 0.9 % IV SOLN: 500.0000 mL | INTRAVENOUS | Status: DC

## 2013-02-22 NOTE — Progress Notes (Signed)
Tongue piercing removed prior to procedure.

## 2013-02-22 NOTE — Progress Notes (Signed)
Called to room to assist during endoscopic procedure.  Patient ID and intended procedure confirmed with present staff. Received instructions for my participation in the procedure from the performing physician. ewm 

## 2013-02-22 NOTE — Progress Notes (Signed)
No complaints noted in the recovery room. Maw   

## 2013-02-22 NOTE — Patient Instructions (Signed)
YOU HAD AN ENDOSCOPIC PROCEDURE TODAY AT THE [facility]: Refer to the procedure report that was given to you for any specific questions about what was found during the examination.  If the procedure report does not answer your questions, please call your gastroenterologist to clarify.  If you requested that your care partner not be given the details of your procedure findings, then the procedure report has been included in a sealed envelope for you to review at your convenience later.  YOU SHOULD EXPECT: Some feelings of bloating in the abdomen. Passage of more gas than usual.  Walking can help get rid of the air that was put into your GI tract during the procedure and reduce the bloating. If you had a lower endoscopy (such as a colonoscopy or flexible sigmoidoscopy) you may notice spotting of blood in your stool or on the toilet paper. If you underwent a bowel prep for your procedure, then you may not have a normal bowel movement for a few days.  DIET: Your first meal following the procedure should be a light meal and then it is ok to progress to your normal diet.  A half-sandwich or bowl of soup is an example of a good first meal.  Heavy or fried foods are harder to digest and may make you feel nauseous or bloated.  Likewise meals heavy in dairy and vegetables can cause extra gas to form and this can also increase the bloating.  Drink plenty of fluids but you should avoid alcoholic beverages for 24 hours.  ACTIVITY: Your care partner should take you home directly after the procedure.  You should plan to take it easy, moving slowly for the rest of the day.  You can resume normal activity the day after the procedure however you should NOT DRIVE or use heavy machinery for 24 hours (because of the sedation medicines used during the test).    SYMPTOMS TO REPORT IMMEDIATELY: A gastroenterologist can be reached at any hour.  During normal business hours, 8:30 AM to 5:00 PM Monday through Friday,  call [phone].  After hours and on weekends, please call the GI answering service at [phone] who will take a message and have the physician on call contact you.   Following lower endoscopy (colonoscopy or flexible sigmoidoscopy):  Excessive amounts of blood in the stool  Significant tenderness or worsening of abdominal pains  Swelling of the abdomen that is new, acute  Fever of 100F or higher    FOLLOW UP: If any biopsies were taken you will be contacted by phone or by letter within the next 1-3 weeks.  Call your gastroenterologist if you have not heard about the biopsies in 3 weeks.  Our staff will call the home number listed on your records the next business day following your procedure to check on you and address any questions or concerns that you may have at that time regarding the information given to you following your procedure. This is a courtesy call and so if there is no answer at the home number and we have not heard from you through the emergency physician on call, we will assume that you have returned to your regular daily activities without incident.  SIGNATURES/CONFIDENTIALITY: You and/or your care partner have signed paperwork which will be entered into your electronic medical record.  These signatures attest to the fact that that the information above on your After Visit Summary has been reviewed and is understood.  Full responsibility of the confidentiality   of this discharge information lies with you and/or your care-partner.   Handout was given to your care partner on polyps. You may resume your current medications today. Await pathology results. Please call if any questions or concerns.

## 2013-02-22 NOTE — Op Note (Signed)
[facility] [address] [place], [address]   COLONOSCOPY PROCEDURE REPORT  PATIENT: Serrano, Diana  MR#: [id number] BIRTHDATE: 27-Jun-1985 , 27  yrs. old GENDER: Female ENDOSCOPIST: Beverley Fiedler, MD PROCEDURE DATE:  02/22/2013 PROCEDURE:   Colonoscopy with snare polypectomy First Screening Colonoscopy - Avg.  risk and is 50 yrs.  old or older - No.  Prior Negative Screening - Now for repeat screening. N/A  History of Adenoma - Now for follow-up colonoscopy & has been > or = to 3 yrs.  N/A  Polyps Removed Today? Yes. ASA CLASS:   Class II INDICATIONS:Change in bowel habits and Patient's immediate family history of colon cancer. MEDICATIONS: MAC sedation, administered by CRNA and propofol (Diprivan) 300mg  IV  DESCRIPTION OF PROCEDURE:   After the risks benefits and alternatives of the procedure were thoroughly explained, informed consent was obtained.  A digital rectal exam revealed no rectal mass.   The LB [id number]-[id number]  endoscope was introduced through the anus and advanced to the terminal ileum which was intubated for a short distance. No adverse events experienced.   The quality of the prep was good, using MoviPrep  The instrument was then slowly withdrawn as the colon was fully examined.    COLON FINDINGS: A sessile polyp measuring 4 mm in size was found in the rectosigmoid colon.  A polypectomy was performed with a cold snare.  The resection was complete and the polyp tissue was completely retrieved.   The mucosa appeared normal in the terminal ileum.  Otherwise normal colon. Retroflexed views revealed no abnormalities. The time to cecum=2 minutes 58 seconds.  Withdrawal time=12 minutes 00 seconds.  The scope was withdrawn and the procedure completed.  COMPLICATIONS: There were no complications.  ENDOSCOPIC IMPRESSION: 1.   Normal mucosa in the terminal ileum 2.   Sessile polyp measuring 4 mm in size was found in the rectosigmoid colon;  polypectomy was performed with a cold snare 3.   The colon mucosa was otherwise normal  RECOMMENDATIONS: 1.  Await pathology results 2.  Timing of repeat colonoscopy will be determined by pathology findings. 3.  You will receive a letter within 1-2 weeks with the results of your biopsy as well as final recommendations.  Please call my office if you have not received a letter after 3 weeks. 4.  Office follow-up as needed   eSigned:  Beverley Fiedler, MD 02/22/2013 5:09 PM

## 2013-02-22 NOTE — Progress Notes (Signed)
Patient did not experience any of the following events: a burn prior to discharge; a fall within the facility; wrong site/side/patient/procedure/implant event; or a hospital transfer or hospital admission upon discharge from the facility. (G8907) Patient did not have preoperative order for IV antibiotic SSI prophylaxis. (G8918)  

## 2013-02-23 ENCOUNTER — Telehealth: Payer: Self-pay | Admitting: *Deleted

## 2013-02-23 NOTE — Telephone Encounter (Signed)
  Follow up Call-  Call back number 02/22/2013 02/02/2013  Post procedure Call Back phone  # [phone] cell  Permission to leave phone message Yes Yes     Patient questions:  Do you have a fever, pain , or abdominal swelling? no Pain Score  0 *  Have you tolerated food without any problems? yes  Have you been able to return to your normal activities? yes  Do you have any questions about your discharge instructions: Diet   no Medications  no Follow up visit  no  Do you have questions or concerns about your Care? no  Actions: * If pain score is 4 or above: No action needed, pain <4.

## 2013-03-03 ENCOUNTER — Encounter: Payer: Self-pay | Admitting: Internal Medicine

## 2013-11-16 IMAGING — CR DG CHEST 2V
2 series · 2 of 2 positions shown · non-contrast
Comparison: July 24, 2008.

CLINICAL DATA: Sore throat with chest pain and difficulty breathing

CHEST - 2 VIEW

[w chest pa]
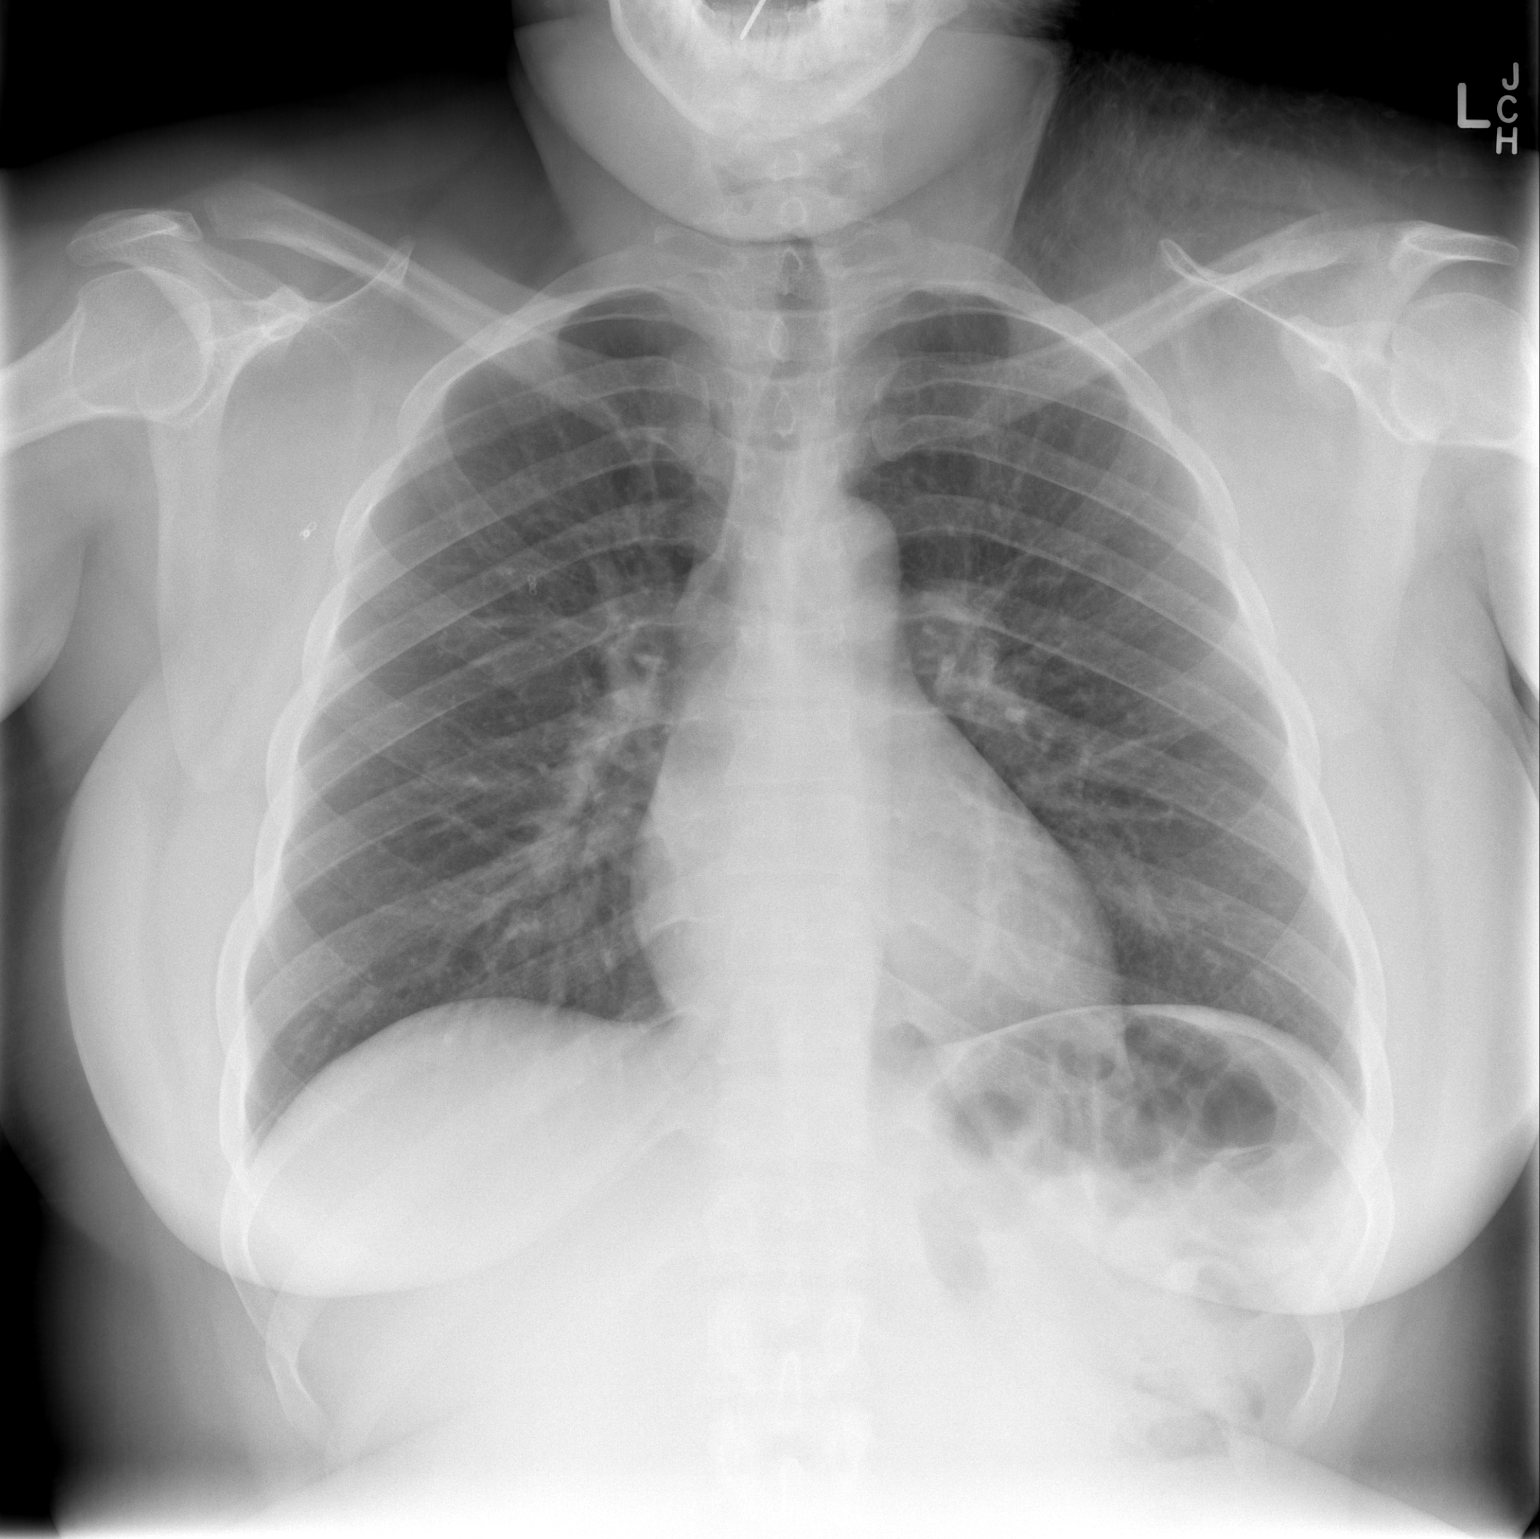

[w chest lat *]
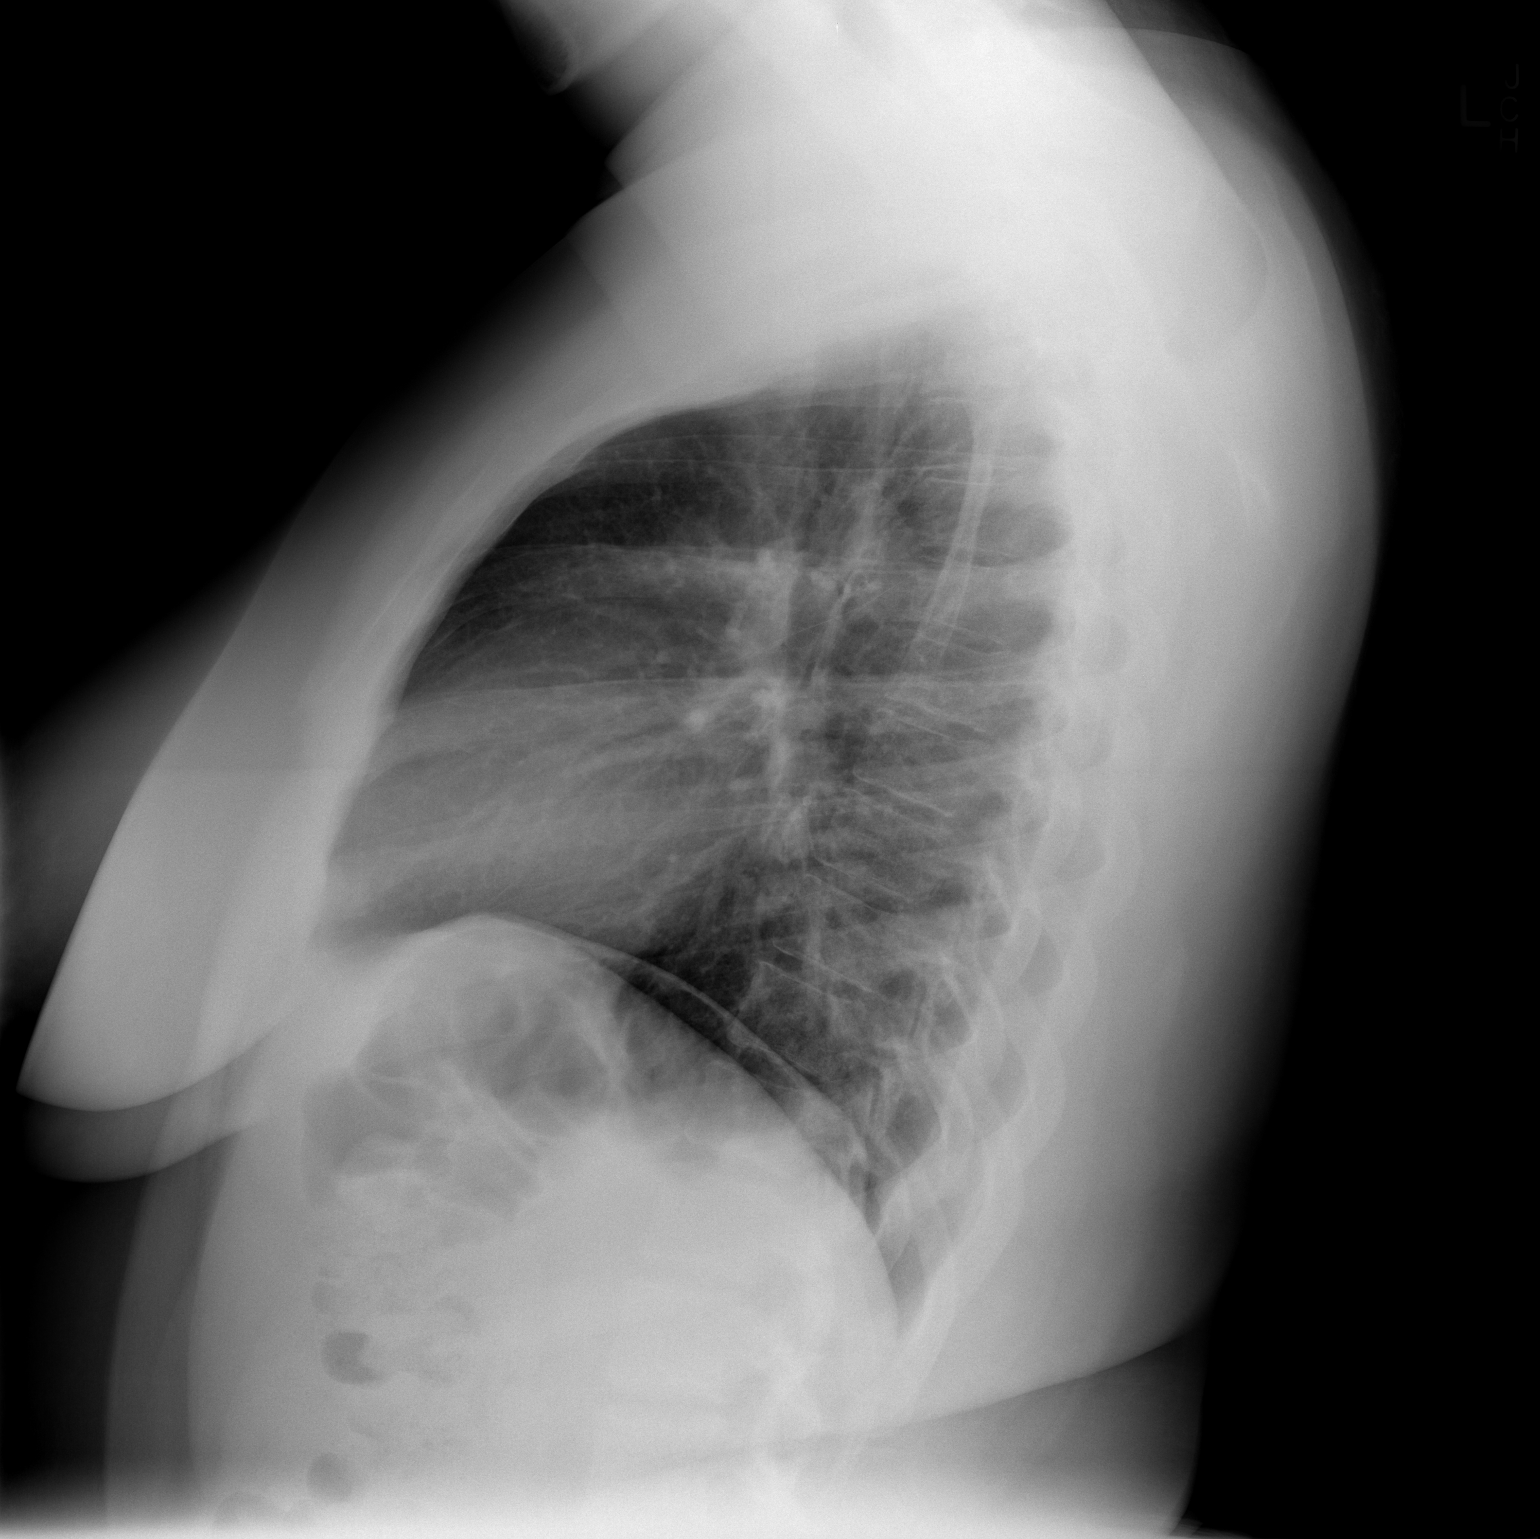

[2 of 2 positions shown; findings below may reference images not displayed]

FINDINGS: Lungs clear.  Heart size and pulmonary vascularity are
normal.  No adenopathy.  No bone lesions.  No pneumothorax.  There
are several small radiopaque foreign bodies either enter overlying
the upper right hemithorax.
IMPRESSION: Several small radiopaque foreign bodies either in or
overlying the upper right hemithorax.  Lungs clear.  No
pneumothorax.

## 2013-12-13 ENCOUNTER — Encounter (HOSPITAL_COMMUNITY): Payer: Self-pay | Admitting: *Deleted

## 2013-12-13 ENCOUNTER — Inpatient Hospital Stay (HOSPITAL_COMMUNITY)
Admission: AD | Admit: 2013-12-13 | Discharge: 2013-12-13 | Disposition: A | Discharge status: Home / Self Care | Payer: Managed Care, Other (non HMO) | Source: Ambulatory Visit | Attending: Family Medicine | Admitting: Family Medicine

## 2013-12-13 DIAGNOSIS — Z87891 Personal history of nicotine dependence: Secondary | ICD-10-CM | POA: Insufficient documentation

## 2013-12-13 DIAGNOSIS — A499 Bacterial infection, unspecified: Secondary | ICD-10-CM | POA: Diagnosis not present

## 2013-12-13 DIAGNOSIS — R109 Unspecified abdominal pain: Secondary | ICD-10-CM | POA: Diagnosis present

## 2013-12-13 DIAGNOSIS — N760 Acute vaginitis: Secondary | ICD-10-CM | POA: Insufficient documentation

## 2013-12-13 DIAGNOSIS — B9689 Other specified bacterial agents as the cause of diseases classified elsewhere: Secondary | ICD-10-CM | POA: Diagnosis not present

## 2013-12-13 LAB — URINALYSIS, ROUTINE W REFLEX MICROSCOPIC
Bilirubin Urine: NEGATIVE
GLUCOSE, UA: NEGATIVE mg/dL
HGB URINE DIPSTICK: NEGATIVE
Ketones, ur: NEGATIVE mg/dL
Leukocytes, UA: NEGATIVE
Nitrite: NEGATIVE
Protein, ur: NEGATIVE mg/dL
SPECIFIC GRAVITY, URINE: 1.02 (ref 1.005–1.030)
UROBILINOGEN UA: 1 mg/dL (ref 0.0–1.0)
pH: 6 (ref 5.0–8.0)

## 2013-12-13 LAB — CBC WITH DIFFERENTIAL/PLATELET
BASOS ABS: 0 10*3/uL (ref 0.0–0.1)
BASOS PCT: 0 % (ref 0–1)
Eosinophils Absolute: 0.2 10*3/uL (ref 0.0–0.7)
Eosinophils Relative: 2 % (ref 0–5)
HCT: 35.9 % — ABNORMAL LOW (ref 36.0–46.0)
Hemoglobin: 12.1 g/dL (ref 12.0–15.0)
Lymphocytes Relative: 26 % (ref 12–46)
Lymphs Abs: 2.2 10*3/uL (ref 0.7–4.0)
MCH: 28.7 pg (ref 26.0–34.0)
MCHC: 33.7 g/dL (ref 30.0–36.0)
MCV: 85.3 fL (ref 78.0–100.0)
Monocytes Absolute: 0.9 10*3/uL (ref 0.1–1.0)
Monocytes Relative: 11 % (ref 3–12)
NEUTROS ABS: 5 10*3/uL (ref 1.7–7.7)
NEUTROS PCT: 61 % (ref 43–77)
Platelets: 369 10*3/uL (ref 150–400)
RBC: 4.21 MIL/uL (ref 3.87–5.11)
RDW: 14.4 % (ref 11.5–15.5)
WBC: 8.3 10*3/uL (ref 4.0–10.5)

## 2013-12-13 LAB — COMPREHENSIVE METABOLIC PANEL
ALBUMIN: 3.8 g/dL (ref 3.5–5.2)
ALK PHOS: 64 U/L (ref 39–117)
ALT: 14 U/L (ref 0–35)
AST: 11 U/L (ref 0–37)
Anion gap: 13 (ref 5–15)
BILIRUBIN TOTAL: 0.2 mg/dL — AB (ref 0.3–1.2)
BUN: 7 mg/dL (ref 6–23)
CO2: 24 meq/L (ref 19–32)
Calcium: 9.3 mg/dL (ref 8.4–10.5)
Chloride: 104 mEq/L (ref 96–112)
Creatinine, Ser: 0.75 mg/dL (ref 0.50–1.10)
GFR calc Af Amer: >90 mL/min (ref >90)
GFR calc non Af Amer: >90 mL/min (ref >90)
Glucose, Bld: 83 mg/dL (ref 70–99)
Potassium: 4 mEq/L (ref 3.7–5.3)
SODIUM: 141 meq/L (ref 137–147)
Total Protein: 7.2 g/dL (ref 6.0–8.3)

## 2013-12-13 LAB — WET PREP, GENITAL
Trich, Wet Prep: NONE SEEN
YEAST WET PREP: NONE SEEN

## 2013-12-13 LAB — POCT PREGNANCY, URINE: PREG TEST UR: NEGATIVE

## 2013-12-13 MED ORDER — METRONIDAZOLE 0.75 % VA GEL: 1.0000 | Freq: Every day | VAGINAL | Status: DC

## 2013-12-13 NOTE — Discharge Instructions (Signed)
Bacterial Vaginosis Bacterial vaginosis is a vaginal infection that occurs when the normal balance of bacteria in the vagina is disrupted. It results from an overgrowth of certain bacteria. This is the most common vaginal infection in women of childbearing age. Treatment is important to prevent complications, especially in pregnant women, as it can cause a premature delivery. CAUSES  Bacterial vaginosis is caused by an increase in harmful bacteria that are normally present in smaller amounts in the vagina. Several different kinds of bacteria can cause bacterial vaginosis. However, the reason that the condition develops is not fully understood. RISK FACTORS Certain activities or behaviors can put you at an increased risk of developing bacterial vaginosis, including:  Having a new sex partner or multiple sex partners.  Douching.  Using an intrauterine device (IUD) for contraception. Women do not get bacterial vaginosis from toilet seats, bedding, swimming pools, or contact with objects around them. SIGNS AND SYMPTOMS  Some women with bacterial vaginosis have no signs or symptoms. Common symptoms include:  Grey vaginal discharge.  A fishlike odor with discharge, especially after sexual intercourse.  Itching or burning of the vagina and vulva.  Burning or pain with urination. DIAGNOSIS  Your health care provider will take a medical history and examine the vagina for signs of bacterial vaginosis. A sample of vaginal fluid may be taken. Your health care provider will look at this sample under a microscope to check for bacteria and abnormal cells. A vaginal pH test may also be done.  TREATMENT  Bacterial vaginosis may be treated with antibiotic medicines. These may be given in the form of a pill or a vaginal cream. A second round of antibiotics may be prescribed if the condition comes back after treatment.  HOME CARE INSTRUCTIONS   Only take over-the-counter or prescription medicines as  directed by your health care provider.  If antibiotic medicine was prescribed, take it as directed. Make sure you finish it even if you start to feel better.  Do not have sex until treatment is completed.  Tell all sexual partners that you have a vaginal infection. They should see their health care provider and be treated if they have problems, such as a mild rash or itching.  Practice safe sex by using condoms and only having one sex partner. SEEK MEDICAL CARE IF:   Your symptoms are not improving after 3 days of treatment.  You have increased discharge or pain.  You have a fever. MAKE SURE YOU:   Understand these instructions.  Will watch your condition.  Will get help right away if you are not doing well or get worse. FOR MORE INFORMATION  Centers for Disease Control and Prevention, Division of STD Prevention: www.cdc.gov/std American Sexual Health Association (ASHA): www.ashastd.org  Document Released: 03/04/2005 Document Revised: 12/23/2012 Document Reviewed: 10/14/2012 Exit[employer] Patient Information 2015 [employer]. This information is not intended to replace advice given to you by your health care provider. Make sure you discuss any questions you have with your health care provider.  

## 2013-12-13 NOTE — MAU Note (Addendum)
Been having really bad lower abd cramps for the past month.   Felt nauseated all day.  Been trying to get pregnant, did not do home test.

## 2013-12-13 NOTE — MAU Provider Note (Signed)
None     Chief Complaint:  Abdominal Pain   Diana Serrano is  28 y.o. G3P0030.  Patient's last menstrual period was 10/31/2013.Marland Kitchen  Her pregnancy status is negative.  She presents complaining of Abdominal Pain for the past month.  She states that it feels just like menstrual cramps, but does not bleed.  The cramps occur once or twice a day, and persist until she takes ibuprofen.  Would like to be pregnant.   Past Medical History  Diagnosis Date  . Migraine   . Bronchitis   . GERD (gastroesophageal reflux disease)     History reviewed. No pertinent past surgical history.  Family History  Problem Relation Age of Onset  . Colon polyps Mother   . Colon cancer Mother     died age 65  . Diabetes Paternal Grandfather   . Esophageal cancer Neg Hx   . Rectal cancer Neg Hx   . Stomach cancer Neg Hx     History  Substance Use Topics  . Smoking status: Former Smoker    Quit date: 06/01/2012  . Smokeless tobacco: Never Used  . Alcohol Use: 1.2 oz/week    2 Shots of liquor per week     Comment: OCCAS- VODKA, last night    Allergies: No Known Allergies  Prescriptions prior to admission  Medication Sig Dispense Refill  . ibuprofen (ADVIL,MOTRIN) 200 MG tablet Take 400 mg by mouth every 6 (six) hours as needed for headache or cramping.         Review of Systems   Constitutional: Negative for fever and chills Eyes: Negative for visual disturbances Respiratory: Negative for shortness of breath, dyspnea Cardiovascular: Negative for chest pain or palpitations  Gastrointestinal: Negative for vomiting, diarrhea and constipation.  Has had some nausea today, no vomiting Genitourinary: Negative for dysuria and urgency Musculoskeletal: Negative for back pain, joint pain, myalgias  Neurological: Negative for dizziness and headaches     Physical Exam   Blood pressure 118/79, pulse 94, temperature 98.8 F (37.1 C), temperature source Oral, resp. rate 18, height 5' 2.2" (1.58 m),  weight 109.77 kg (242 lb), last menstrual period 10/31/2013.  General: General appearance - alert, well appearing, and in no distress Chest - clear to auscultation, no wheezes, rales or rhonchi, symmetric air entry Heart - normal rate and regular rhythm Abdomen - soft, nontender, nondistended, no masses  Pelvic - normal external genitalia, vulva, vagina, cervix, uterus and adnexa. No areas of tenderness.  Discharge thin and white, slight amine odor.  Extremities - no pedal edema noted   Labs: Results for orders placed during the hospital encounter of 12/13/13 (from the past 24 hour(s))  URINALYSIS, ROUTINE W REFLEX MICROSCOPIC   Collection Time    12/13/13  5:58 PM      Result Value Ref Range   Color, Urine YELLOW  YELLOW   APPearance CLEAR  CLEAR   Specific Gravity, Urine 1.020  1.005 - 1.030   pH 6.0  5.0 - 8.0   Glucose, UA NEGATIVE  NEGATIVE mg/dL   Hgb urine dipstick NEGATIVE  NEGATIVE   Bilirubin Urine NEGATIVE  NEGATIVE   Ketones, ur NEGATIVE  NEGATIVE mg/dL   Protein, ur NEGATIVE  NEGATIVE mg/dL   Urobilinogen, UA 1.0  0.0 - 1.0 mg/dL   Nitrite NEGATIVE  NEGATIVE   Leukocytes, UA NEGATIVE  NEGATIVE  POCT PREGNANCY, URINE   Collection Time    12/13/13  6:01 PM      Result Value Ref Range  Preg Test, Ur NEGATIVE  NEGATIVE  CBC WITH DIFFERENTIAL   Collection Time    12/13/13  7:14 PM      Result Value Ref Range   WBC 8.3  4.0 - 10.5 K/uL   RBC 4.21  3.87 - 5.11 MIL/uL   Hemoglobin 12.1  12.0 - 15.0 g/dL   HCT 35.9 (*) 36.0 - 46.0 %   MCV 85.3  78.0 - 100.0 fL   MCH 28.7  26.0 - 34.0 pg   MCHC 33.7  30.0 - 36.0 g/dL   RDW 14.4  11.5 - 15.5 %   Platelets 369  150 - 400 K/uL   Neutrophils Relative % 61  43 - 77 %   Neutro Abs 5.0  1.7 - 7.7 K/uL   Lymphocytes Relative 26  12 - 46 %   Lymphs Abs 2.2  0.7 - 4.0 K/uL   Monocytes Relative 11  3 - 12 %   Monocytes Absolute 0.9  0.1 - 1.0 K/uL   Eosinophils Relative 2  0 - 5 %   Eosinophils Absolute 0.2  0.0 - 0.7  K/uL   Basophils Relative 0  0 - 1 %   Basophils Absolute 0.0  0.0 - 0.1 K/uL  COMPREHENSIVE METABOLIC PANEL   Collection Time    12/13/13  7:14 PM      Result Value Ref Range   Sodium 141  137 - 147 mEq/L   Potassium 4.0  3.7 - 5.3 mEq/L   Chloride 104  96 - 112 mEq/L   CO2 24  19 - 32 mEq/L   Glucose, Bld 83  70 - 99 mg/dL   BUN 7  6 - 23 mg/dL   Creatinine, Ser 0.75  0.50 - 1.10 mg/dL   Calcium 9.3  8.4 - 10.5 mg/dL   Total Protein 7.2  6.0 - 8.3 g/dL   Albumin 3.8  3.5 - 5.2 g/dL   AST 11  0 - 37 U/L   ALT 14  0 - 35 U/L   Alkaline Phosphatase 64  39 - 117 U/L   Total Bilirubin 0.2 (*) 0.3 - 1.2 mg/dL   GFR calc non Af Amer >90  >90 mL/min   GFR calc Af Amer >90  >90 mL/min   Anion gap 13  5 - 15  WET PREP, GENITAL   Collection Time    12/13/13  8:00 PM      Result Value Ref Range   Yeast Wet Prep HPF POC NONE SEEN  NONE SEEN   Trich, Wet Prep NONE SEEN  NONE SEEN   Clue Cells Wet Prep HPF POC FEW (*) NONE SEEN   WBC, Wet Prep HPF POC FEW (*) NONE SEEN   Imaging Studies:  No results found.   Assessment:  Abdominal cramps for 1 month:  GI vs GU  Plan: Treat for BV, although unlikely.  F/U with [facility] if sx persist.    CRESENZO-DISHMAN,Cierah Crader

## 2013-12-14 LAB — GC/CHLAMYDIA PROBE AMP
CT Probe RNA: NEGATIVE
GC Probe RNA: NEGATIVE

## 2013-12-14 LAB — HIV ANTIBODY (ROUTINE TESTING W REFLEX): HIV 1&2 Ab, 4th Generation: NONREACTIVE

## 2013-12-14 NOTE — MAU Provider Note (Signed)
Attestation of Attending Supervision of Advanced Practitioner (PA/CNM/NP): Evaluation and management procedures were performed by the Advanced Practitioner under my supervision and collaboration.  I have reviewed the Advanced Practitioner's note and chart, and I agree with the management and plan.  Adael Culbreath, DO Attending Physician Faculty Practice, [facility]  

## 2014-01-17 ENCOUNTER — Encounter (HOSPITAL_COMMUNITY): Payer: Self-pay | Admitting: *Deleted

## 2014-02-01 ENCOUNTER — Encounter (HOSPITAL_COMMUNITY): Payer: Self-pay | Admitting: Emergency Medicine

## 2014-02-01 ENCOUNTER — Emergency Department (INDEPENDENT_AMBULATORY_CARE_PROVIDER_SITE_OTHER)
Admission: EM | Admit: 2014-02-01 | Discharge: 2014-02-01 | Disposition: A | Discharge status: Home / Self Care | Payer: Managed Care, Other (non HMO) | Source: Home / Self Care | Attending: Family Medicine | Admitting: Family Medicine

## 2014-02-01 DIAGNOSIS — R112 Nausea with vomiting, unspecified: Secondary | ICD-10-CM

## 2014-02-01 DIAGNOSIS — K088 Other specified disorders of teeth and supporting structures: Secondary | ICD-10-CM

## 2014-02-01 DIAGNOSIS — K0889 Other specified disorders of teeth and supporting structures: Secondary | ICD-10-CM

## 2014-02-01 LAB — POCT PREGNANCY, URINE: PREG TEST UR: NEGATIVE

## 2014-02-01 MED ORDER — ONDANSETRON HCL 4 MG PO TABS: 4.0000 mg | ORAL_TABLET | Freq: Three times a day (TID) | ORAL | Status: DC | PRN

## 2014-02-01 MED ORDER — CHLORHEXIDINE GLUCONATE 0.12 % MT SOLN: 15.0000 mL | Freq: Two times a day (BID) | OROMUCOSAL | Status: DC

## 2014-02-01 MED ORDER — BENZOCAINE 10 % MT GEL: 1.0000 "application " | Freq: Two times a day (BID) | OROMUCOSAL | Status: DC | PRN

## 2014-02-01 NOTE — ED Notes (Signed)
Pain to right side of mouth and face  Also concerned about vomiting intermittently

## 2014-02-01 NOTE — ED Provider Notes (Signed)
CSN: [id number]     Arrival date & time 02/01/14  1236 History   First MD Initiated Contact with Patient 02/01/14 1353     Chief Complaint  Patient presents with  . Dental Pain  . Emesis   (Consider location/radiation/quality/duration/timing/severity/associated sxs/prior Treatment) Patient is a 28 y.o. female presenting with tooth pain and vomiting. The history is provided by the patient.  Dental Pain Location:  Lower Quality:  Localized Severity:  Mild Onset quality:  Gradual Duration:  1 month Timing:  Intermittent Progression:  Worsening Chronicity:  New Context: not dental fracture   Relieved by:  Nothing Worsened by:  Cold food/drink Ineffective treatments:  NSAIDs Associated symptoms: no facial swelling and no fever   Risk factors: lack of dental care   Emesis Associated symptoms: no diarrhea      Patient is a 28 yo female presenting with dental pain. Notes this started a month ago. It is worse with cold liquids. It is on the right lower side of her mouth. She notes there is intermittent bleeding of her gums when she brushes her teeth. She has not seen a dentist since high school and does not floss. Has tried ibuprfen and goody powders for discomfort. She also notes vomiting and nausea 3-4x over the past week. Notes cramping like she is going to have her period, though has not had a period since 11/16/13. Seen in MAU on 9/28 with negative UPreg. Denies fevers and dry mouth.   Past Medical History  Diagnosis Date  . Migraine   . Bronchitis   . GERD (gastroesophageal reflux disease)    History reviewed. No pertinent past surgical history. Family History  Problem Relation Age of Onset  . Colon polyps Mother   . Colon cancer Mother     died age 79  . Diabetes Paternal Grandfather   . Esophageal cancer Neg Hx   . Rectal cancer Neg Hx   . Stomach cancer Neg Hx    History  Substance Use Topics  . Smoking status: Former Smoker    Quit date: 06/01/2012  . Smokeless  tobacco: Never Used  . Alcohol Use: 1.2 oz/week    2 Shots of liquor per week     Comment: OCCAS- VODKA, last night   OB History    Gravida Para Term Preterm AB TAB SAB Ectopic Multiple Living   3    3 1 2    0     Review of Systems  Constitutional: Negative for fever.  HENT: Positive for dental problem. Negative for facial swelling.   Gastrointestinal: Positive for nausea and vomiting. Negative for diarrhea.       Cramping in abdomen    Allergies  Review of patient's allergies indicates no known allergies.  Home Medications   Prior to Admission medications   Medication Sig Start Date End Date Taking? Authorizing Provider  benzocaine (ORAJEL) 10 % mucosal gel Use as directed 1 application in the mouth or throat 2 (two) times daily as needed for mouth pain. 02/01/14   Leone Haven, MD  chlorhexidine (PERIDEX) 0.12 % solution Use as directed 15 mLs in the mouth or throat 2 (two) times daily. 02/01/14   Leone Haven, MD  ibuprofen (ADVIL,MOTRIN) 200 MG tablet Take 400 mg by mouth every 6 (six) hours as needed for headache or cramping.    Historical Provider, MD  metroNIDAZOLE (METROGEL) 0.75 % vaginal gel Place 1 Applicatorful vaginally at bedtime. Apply one applicatorful to vagina at bedtime for 5 days  12/13/13   Christin Fudge, CNM  ondansetron (ZOFRAN) 4 MG tablet Take 1 tablet (4 mg total) by mouth every 8 (eight) hours as needed for nausea or vomiting. 02/01/14   Leone Haven, MD   BP 125/73 mmHg  Pulse 84  Temp(Src) 98.3 F (36.8 C) (Oral)  Resp 12  SpO2 99% Physical Exam  Constitutional: She appears well-developed and well-nourished. No distress.  HENT:  Head: Normocephalic and atraumatic.  Mouth/Throat: Oropharynx is clear and moist.  No apparent dental carries or broken teeth, no swelling of gingiva, no drainage from gingiva, no apparent abnormalities of stensons duct, mild tenderness on external palpation of the right lower teeth, though no  tenderness on palpation of teeth or gingiva, no swelling of stensons duct  Eyes: Pupils are equal, round, and reactive to light.  Neck: Neck supple.  Cardiovascular: Normal rate, regular rhythm and normal heart sounds.   Pulmonary/Chest: Effort normal and breath sounds normal.  Abdominal: Soft. Bowel sounds are normal. She exhibits no distension. There is no tenderness. There is no rebound and no guarding.  Musculoskeletal: She exhibits no edema.  Neurological: She is alert.  Skin: Skin is warm and dry.    ED Course  Procedures (including critical care time) Labs Review Labs Reviewed  POCT PREGNANCY, URINE    Imaging Review No results found.   MDM   1. Pain, dental   2. Non-intractable vomiting with nausea, vomiting of unspecified type     Patient is a 28 yo female presenting with dental pain likely related to dental carries. No swelling or pain on palpation of teeth or gingiva to indicate abscess. Will give oragel for discomfort. Chlorhexadine mouth wash to help prevent infection of caries. She will follow-up with a dentist. Pregnancy test was negative. Vomiting possibly related to use of goody powder and ibuprofen causing irritation of stomach. Zofran prescription given for nausea. Advised to f/u with her OBGYN if continues to have issue with absence of period.   This patient was discussed with and seen by Dr Marily Memos. He helped develop the plan of care for this patient.    Tommi Rumps, MD [facility] PGY3    Leone Haven, MD 02/01/14 [phone]

## 2014-02-01 NOTE — Discharge Instructions (Signed)
Your dental pain is likely related to cavities. You need to see a dentist for this issue. You should call dentists in the area to set up an appointment. You should follow-up with your OBGYN if you continue to go without a period.   Dental Pain Toothache is pain in or around a tooth. It may get worse with chewing or with cold or heat.  HOME CARE  Your dentist may use a numbing medicine during treatment. If so, you may need to avoid eating until the medicine wears off. Ask your dentist about this.  Only take medicine as told by your dentist or doctor.  Avoid chewing food near the painful tooth until after all treatment is done. Ask your dentist about this. GET HELP RIGHT AWAY IF:   The problem gets worse or new problems appear.  You have a fever.  There is redness and puffiness (swelling) of the face, jaw, or neck.  You cannot open your mouth.  There is pain in the jaw.  There is very bad pain that is not helped by medicine. MAKE SURE YOU:   Understand these instructions.  Will watch your condition.  Will get help right away if you are not doing well or get worse. Document Released: 08/21/2007 Document Revised: 05/27/2011 Document Reviewed: 08/21/2007 [facility] Patient Information 2015 [place], [place]. This information is not intended to replace advice given to you by your health care provider. Make sure you discuss any questions you have with your health care provider.

## 2014-05-20 ENCOUNTER — Encounter (HOSPITAL_COMMUNITY): Payer: Self-pay | Admitting: Emergency Medicine

## 2014-05-20 ENCOUNTER — Emergency Department (HOSPITAL_COMMUNITY)
Admission: EM | Admit: 2014-05-20 | Discharge: 2014-05-20 | Disposition: A | Discharge status: Home / Self Care | Payer: Managed Care, Other (non HMO) | Attending: Emergency Medicine | Admitting: Emergency Medicine

## 2014-05-20 DIAGNOSIS — Y998 Other external cause status: Secondary | ICD-10-CM | POA: Insufficient documentation

## 2014-05-20 DIAGNOSIS — Y9389 Activity, other specified: Secondary | ICD-10-CM | POA: Insufficient documentation

## 2014-05-20 DIAGNOSIS — W01198A Fall on same level from slipping, tripping and stumbling with subsequent striking against other object, initial encounter: Secondary | ICD-10-CM | POA: Diagnosis not present

## 2014-05-20 DIAGNOSIS — Y9289 Other specified places as the place of occurrence of the external cause: Secondary | ICD-10-CM | POA: Diagnosis not present

## 2014-05-20 DIAGNOSIS — S0990XA Unspecified injury of head, initial encounter: Secondary | ICD-10-CM

## 2014-05-20 DIAGNOSIS — Z8719 Personal history of other diseases of the digestive system: Secondary | ICD-10-CM | POA: Diagnosis not present

## 2014-05-20 DIAGNOSIS — Z8709 Personal history of other diseases of the respiratory system: Secondary | ICD-10-CM | POA: Diagnosis not present

## 2014-05-20 DIAGNOSIS — Z87891 Personal history of nicotine dependence: Secondary | ICD-10-CM | POA: Insufficient documentation

## 2014-05-20 MED ORDER — SODIUM CHLORIDE 0.9 % IV BOLUS (SEPSIS)
1000.0000 mL | Freq: Once | INTRAVENOUS | Status: AC
  Administered 2014-05-20: 1000 mL via INTRAVENOUS

## 2014-05-20 MED ORDER — KETOROLAC TROMETHAMINE 30 MG/ML IJ SOLN
30.0000 mg | Freq: Once | INTRAMUSCULAR | Status: AC
  Administered 2014-05-20: 30 mg via INTRAVENOUS
  Filled 2014-05-20: qty 1

## 2014-05-20 MED ORDER — IBUPROFEN 800 MG PO TABS: 800.0000 mg | ORAL_TABLET | Freq: Three times a day (TID) | ORAL | Status: DC | PRN

## 2014-05-20 NOTE — Discharge Instructions (Signed)
Return here as needed.  Follow-up with a primary care doctor.  Use ice on your forehead
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
## 2015-03-24 ENCOUNTER — Emergency Department (HOSPITAL_COMMUNITY): Payer: Managed Care, Other (non HMO)

## 2015-03-24 ENCOUNTER — Encounter (HOSPITAL_COMMUNITY): Payer: Self-pay | Admitting: Emergency Medicine

## 2015-03-24 ENCOUNTER — Emergency Department (HOSPITAL_COMMUNITY)
Admission: EM | Admit: 2015-03-24 | Discharge: 2015-03-24 | Disposition: A | Discharge status: Home / Self Care | Payer: Managed Care, Other (non HMO) | Attending: Emergency Medicine | Admitting: Emergency Medicine

## 2015-03-24 DIAGNOSIS — Z3202 Encounter for pregnancy test, result negative: Secondary | ICD-10-CM | POA: Insufficient documentation

## 2015-03-24 DIAGNOSIS — Z87891 Personal history of nicotine dependence: Secondary | ICD-10-CM | POA: Diagnosis not present

## 2015-03-24 DIAGNOSIS — Y92410 Unspecified street and highway as the place of occurrence of the external cause: Secondary | ICD-10-CM | POA: Diagnosis not present

## 2015-03-24 DIAGNOSIS — S39012A Strain of muscle, fascia and tendon of lower back, initial encounter: Secondary | ICD-10-CM | POA: Diagnosis not present

## 2015-03-24 DIAGNOSIS — Z8719 Personal history of other diseases of the digestive system: Secondary | ICD-10-CM | POA: Insufficient documentation

## 2015-03-24 DIAGNOSIS — S0990XA Unspecified injury of head, initial encounter: Secondary | ICD-10-CM | POA: Diagnosis not present

## 2015-03-24 DIAGNOSIS — Z8679 Personal history of other diseases of the circulatory system: Secondary | ICD-10-CM | POA: Insufficient documentation

## 2015-03-24 DIAGNOSIS — Y998 Other external cause status: Secondary | ICD-10-CM | POA: Diagnosis not present

## 2015-03-24 DIAGNOSIS — Z8709 Personal history of other diseases of the respiratory system: Secondary | ICD-10-CM | POA: Diagnosis not present

## 2015-03-24 DIAGNOSIS — Y9389 Activity, other specified: Secondary | ICD-10-CM | POA: Insufficient documentation

## 2015-03-24 DIAGNOSIS — S3992XA Unspecified injury of lower back, initial encounter: Secondary | ICD-10-CM | POA: Diagnosis present

## 2015-03-24 LAB — POC URINE PREG, ED: Preg Test, Ur: NEGATIVE

## 2015-03-24 MED ORDER — IBUPROFEN 800 MG PO TABS: 800.0000 mg | ORAL_TABLET | Freq: Three times a day (TID) | ORAL | Status: DC

## 2015-03-24 NOTE — ED Notes (Signed)
POC urine pregnancy is negative ?

## 2015-03-24 NOTE — Discharge Instructions (Signed)
Follow up with your md if any problems °
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
## 2015-03-24 NOTE — ED Notes (Signed)
X-ray requested pregnancy test prior to x-rays.

## 2015-04-12 ENCOUNTER — Emergency Department (HOSPITAL_COMMUNITY)
Admission: EM | Admit: 2015-04-12 | Discharge: 2015-04-12 | Disposition: A | Discharge status: Home / Self Care | Payer: Managed Care, Other (non HMO) | Attending: Emergency Medicine | Admitting: Emergency Medicine

## 2015-04-12 ENCOUNTER — Encounter (HOSPITAL_COMMUNITY): Payer: Self-pay | Admitting: Emergency Medicine

## 2015-04-12 ENCOUNTER — Emergency Department (HOSPITAL_COMMUNITY): Payer: Managed Care, Other (non HMO)

## 2015-04-12 DIAGNOSIS — Z8679 Personal history of other diseases of the circulatory system: Secondary | ICD-10-CM | POA: Diagnosis not present

## 2015-04-12 DIAGNOSIS — Z8709 Personal history of other diseases of the respiratory system: Secondary | ICD-10-CM | POA: Diagnosis not present

## 2015-04-12 DIAGNOSIS — Z8719 Personal history of other diseases of the digestive system: Secondary | ICD-10-CM | POA: Diagnosis not present

## 2015-04-12 DIAGNOSIS — S46811A Strain of other muscles, fascia and tendons at shoulder and upper arm level, right arm, initial encounter: Secondary | ICD-10-CM | POA: Diagnosis not present

## 2015-04-12 DIAGNOSIS — Z87891 Personal history of nicotine dependence: Secondary | ICD-10-CM | POA: Insufficient documentation

## 2015-04-12 DIAGNOSIS — S4991XA Unspecified injury of right shoulder and upper arm, initial encounter: Secondary | ICD-10-CM | POA: Diagnosis present

## 2015-04-12 DIAGNOSIS — W108XXA Fall (on) (from) other stairs and steps, initial encounter: Secondary | ICD-10-CM | POA: Insufficient documentation

## 2015-04-12 DIAGNOSIS — Z3202 Encounter for pregnancy test, result negative: Secondary | ICD-10-CM | POA: Diagnosis not present

## 2015-04-12 DIAGNOSIS — Y998 Other external cause status: Secondary | ICD-10-CM | POA: Insufficient documentation

## 2015-04-12 DIAGNOSIS — Y9289 Other specified places as the place of occurrence of the external cause: Secondary | ICD-10-CM | POA: Diagnosis not present

## 2015-04-12 DIAGNOSIS — Y9389 Activity, other specified: Secondary | ICD-10-CM | POA: Diagnosis not present

## 2015-04-12 DIAGNOSIS — S46911A Strain of unspecified muscle, fascia and tendon at shoulder and upper arm level, right arm, initial encounter: Secondary | ICD-10-CM

## 2015-04-12 LAB — POC URINE PREG, ED: Preg Test, Ur: NEGATIVE

## 2015-04-12 MED ORDER — CYCLOBENZAPRINE HCL 5 MG PO TABS: 5.0000 mg | ORAL_TABLET | Freq: Three times a day (TID) | ORAL | Status: DC | PRN

## 2015-04-12 MED ORDER — IBUPROFEN 800 MG PO TABS: 800.0000 mg | ORAL_TABLET | Freq: Three times a day (TID) | ORAL | Status: DC

## 2015-04-12 NOTE — ED Provider Notes (Signed)
CSN: [id number]     Arrival date & time 04/12/15  1652 History   First MD Initiated Contact with Patient 04/12/15 1725     Chief Complaint  Patient presents with  . Shoulder Injury     (Consider location/radiation/quality/duration/timing/severity/associated sxs/prior Treatment) The history is provided by the patient.   Diana Serrano is a 30 y.o. female presenting with right shoulder pain which she describes as aching and sore since she fell last night, slipping as she was going down steps and hyperextended the right shoulder as she caught herself with the handrail when she landed on her buttocks.  Her pain has been ok at rest, worsens with movement.  She denies radiation of pain and denies weakness or numbness in her distal arm or hand.  She has taken tylenol without relief of pain.     Past Medical History  Diagnosis Date  . Migraine   . Bronchitis   . GERD (gastroesophageal reflux disease)    History reviewed. No pertinent past surgical history. Family History  Problem Relation Age of Onset  . Colon polyps Mother   . Colon cancer Mother     died age 74  . Diabetes Paternal Grandfather   . Esophageal cancer Neg Hx   . Rectal cancer Neg Hx   . Stomach cancer Neg Hx    Social History  Substance Use Topics  . Smoking status: Former Smoker    Quit date: 06/01/2012  . Smokeless tobacco: Never Used  . Alcohol Use: 1.2 oz/week    2 Shots of liquor per week     Comment: OCCAS- VODKA   OB History    Gravida Para Term Preterm AB TAB SAB Ectopic Multiple Living   3    3 1 2    0     Review of Systems  Constitutional: Negative for fever.  Musculoskeletal: Positive for arthralgias. Negative for myalgias and joint swelling.  Neurological: Negative for weakness and numbness.      Allergies  Review of patient's allergies indicates no known allergies.  Home Medications   Prior to Admission medications   Medication Sig Start Date End Date Taking? Authorizing Provider   cyclobenzaprine (FLEXERIL) 5 MG tablet Take 1 tablet (5 mg total) by mouth 3 (three) times daily as needed for muscle spasms. 04/12/15   Evalee Jefferson, PA-C  ibuprofen (ADVIL,MOTRIN) 800 MG tablet Take 1 tablet (800 mg total) by mouth 3 (three) times daily. 04/12/15   Evalee Jefferson, PA-C   BP 102/72 mmHg  Pulse 94  Temp(Src) 97.6 F (36.4 C) (Oral)  Resp 18  Ht 5\' 4"  (1.626 m)  Wt 112.492 kg  BMI 42.55 kg/m2  SpO2 100%  LMP 03/14/2015 Physical Exam  Constitutional: She appears well-developed and well-nourished.  HENT:  Head: Atraumatic.  Neck: Normal range of motion.  Cardiovascular:  Pulses equal bilaterally  Musculoskeletal: She exhibits tenderness.       Right shoulder: She exhibits tenderness. She exhibits normal range of motion, no swelling, no effusion, no crepitus, no deformity, no spasm, normal pulse and normal strength.  Neurological: She is alert. She has normal strength. She displays normal reflexes. No sensory deficit.  Skin: Skin is warm and dry.  Psychiatric: She has a normal mood and affect.    ED Course  Procedures (including critical care time) Labs Review Labs Reviewed  POC URINE PREG, ED    Imaging Review Dg Shoulder Right  04/12/2015  CLINICAL DATA:  Slipped and fell while running down steps at  work last night, RIGHT shoulder pain since in increased with abduction, initial encounter EXAM: RIGHT SHOULDER - 2+ VIEW COMPARISON:  None FINDINGS: Osseous mineralization normal. AC joint alignment normal. No acute fracture, dislocation or bone destruction. Visualized RIGHT ribs intact. IMPRESSION: Normal exam. Electronically Signed   By: Lavonia Dana M.D.   On: 04/12/2015 17:15   I have personally reviewed and evaluated these images and lab results as part of my medical decision-making.   EKG Interpretation None      MDM   Final diagnoses:  Shoulder strain, right, initial encounter    Shoulder strain/muscle spasm.  Pt advised ice, rest, ibuprofen, flexeril  prescribed.  Referral to ortho prn if sx persist beyond the next 10 days.  The patient appears reasonably screened and/or stabilized for discharge and I doubt any other medical condition or other [facility] requiring further screening, evaluation, or treatment in the ED at this time prior to discharge.     Evalee Jefferson, PA-C 04/12/15 1859  Ezequiel Essex, MD 04/12/15 [phone]

## 2015-04-12 NOTE — Discharge Instructions (Signed)
Muscle Strain A muscle strain is an injury that occurs when a muscle is stretched beyond its normal length. Usually a small number of muscle fibers are torn when this happens. Muscle strain is rated in degrees. First-degree strains have the least amount of muscle fiber tearing and pain. Second-degree and third-degree strains have increasingly more tearing and pain.  Usually, recovery from muscle strain takes 1-2 weeks. Complete healing takes 5-6 weeks.  CAUSES  Muscle strain happens when a sudden, violent force placed on a muscle stretches it too far. This may occur with lifting, sports, or a fall.  RISK FACTORS Muscle strain is especially common in athletes.  SIGNS AND SYMPTOMS At the site of the muscle strain, there may be:  Pain.  Bruising.  Swelling.  Difficulty using the muscle due to pain or lack of normal function. DIAGNOSIS  Your health care provider will perform a physical exam and ask about your medical history. TREATMENT  Often, the best treatment for a muscle strain is resting, icing, and applying cold compresses to the injured area.  HOME CARE INSTRUCTIONS   Use the PRICE method of treatment to promote muscle healing during the first 2-3 days after your injury. The PRICE method involves:  Protecting the muscle from being injured again.  Restricting your activity and resting the injured body part.  Icing your injury. To do this, put ice in a plastic bag. Place a towel between your skin and the bag. Then, apply the ice and leave it on for 10 minutes each hour. After the third day, switch to moist heat packs.  Apply compression to the injured area with a splint or elastic bandage. Be careful not to wrap it too tightly. This may interfere with blood circulation or increase swelling.  Elevate the injured body part above the level of your heart as often as you can.  Only take over-the-counter or prescription medicines for pain, discomfort, or fever as directed by your  health care provider.  Warming up prior to exercise helps to prevent future muscle strains. SEEK MEDICAL CARE IF:   You have increasing pain or swelling in the injured area.  You have numbness, tingling, or a significant loss of strength in the injured area. MAKE SURE YOU:   Understand these instructions.  Will watch your condition.  Will get help right away if you are not doing well or get worse.   This information is not intended to replace advice given to you by your health care provider. Make sure you discuss any questions you have with your health care provider.   Document Released: 03/04/2005 Document Revised: 12/23/2012 Document Reviewed: 10/01/2012 Elsevier Interactive Patient Education 2016 [employer].     Emergency Department Resource Guide 1) Find a Doctor and Pay Out of Pocket Although you won't have to find out who is covered by your insurance plan, it is a good idea to ask around and get recommendations. You will then need to call the office and see if the doctor you have chosen will accept you as a new patient and what types of options they offer for patients who are self-pay. Some doctors offer discounts or will set up payment plans for their patients who do not have insurance, but you will need to ask so you aren't surprised when you get to your appointment.  2) Contact Your Local Health Department Not all health departments have doctors that can see patients for sick visits, but many do, so it is worth a call to  see if yours does. If you don't know where your local health department is, you can check in your phone book. The CDC also has a tool to help you locate your state's health department, and many state websites also have listings of all of their local health departments.  3) Find a [facility] If your illness is not likely to be very severe or complicated, you may want to try a walk in clinic. These are popping up all over the country in pharmacies,  drugstores, and shopping centers. They're usually staffed by nurse practitioners or physician assistants that have been trained to treat common illnesses and complaints. They're usually fairly quick and inexpensive. However, if you have serious medical issues or chronic medical problems, these are probably not your best option.  No Primary Care Doctor: - Call Health Connect at  [phone] - they can help you locate a primary care doctor that  accepts your insurance, provides certain services, etc. - Physician Referral Service- [phone]  Chronic Pain Problems: Organization         Address  Phone   Notes  [facility]  [phone] Patients need to be referred by their primary care doctor.   Medication Assistance: Organization         Address  Phone   Notes  [facility] Medication [facility] [address] [place], [place] [address] [phone] --Must be a resident of [facility] [facility] -- Must have NO insurance coverage whatsoever (no Medicaid/ Medicare, etc.) -- The pt. MUST have a primary care doctor that directs their care regularly and follows them in the community   MedAssist  [phone]   [employer]  [phone]    Agencies that provide inexpensive medical care: Organization         Address  Phone   Notes  [place]  [phone]   [facility]    [phone]   [facility] [place], [place] [address] [phone]   [place] [address], [place] [phone]   Planned Parenthood    [phone]   [facility]    [phone]   [place] and [place] [address], [place] Phone:  [phone], Fax:  [phone] Hours of Operation:  9 am - 6 pm, M-F.  Also accepts Medicaid/Medicare and self-pay.  [facility] for [place], [address], [place] Phone:  [phone], Fax: [phone]. Hours of Operation:  8:30 am - 5:30 pm, M-F.  Also accepts Medicaid and self-pay.  [facility] [place] [address] [place] Phone: [phone]   [place], [place], [place] [phone], Ext. [phone] Mondays & Thursdays: 7-9 AM.  First 15 patients are seen on a first come, first serve basis.    [place] Providers:  Organization         Address  Phone   Notes  [facility] [address], [place] [phone] Also accepts self-pay patients.  [place], [place]  [phone]   [place], [address], [place] [phone]   [place] [address] [place] [phone]   Lucianne Lei [address], [place]   [phone] Only accepts [place] Access Medicaid patients after they have their name applied to their card.   Self-Pay (no insurance) in [facility]:  Organization         Address  Phone   Notes  Sickle Cell Patients, [facility] [place] [phone]   [facility] [place] [phone]   [facility] [place]  [place], [place], [place] [phone]   [facility]/Dr. Osei-Bonsu  [address] [place] or [address], [place] [phone] Phone number for both [place] and [place] locations is the same.  [facility] [address] [place] [phone]   [facility] [address], [place] or [address] [phone]   [facility] [address], [place] [phone], phone; [phone], fax Sees patients 1st and 3rd Saturday of every month.  Must not qualify for public  or private insurance (i.e. Medicaid, Medicare, [place] Health Choice, Veterans' Benefits)  Household income should be no more than 200% of the poverty level The clinic cannot treat you if you are pregnant or think you are pregnant  Sexually transmitted diseases are not treated at the clinic.    Dental Care: Organization         Address  Phone  Notes  [facility] Department of [facility] [phone] Accepts children up to age 23 who are enrolled in [place] or [place]; pregnant women with a Medicaid card; and children who have applied for Medicaid or [place] Health Choice, but were declined, whose parents can pay a reduced fee at time of service.  [facility] Department of [facility]  [address], [place] [phone] Accepts children up to age 60 who are enrolled in [place] or [place]; pregnant women with a Medicaid card; and children who have applied for Medicaid or  Health Choice, but were declined, whose parents can pay a reduced fee at time of service.  [place] Adult Dental Access PROGRAM  [place] [phone] Patients are seen by appointment only. Walk-ins are not accepted. [place] will see patients 43 years of age and older. Monday - Tuesday (8am-5pm) Most Wednesdays (8:30-5pm) $30 per visit, cash only  [facility] Adult [facility] Access PRO[facility]  [address], [facility] [phone] Patients are seen by appointment only. Walk-ins are not accepted. [place] will see patients 62 years of age and older. One Wednesday Evening (Monthly: Volunteer Based).  $30 per visit, cash only  [place]  [phone] for adults; Children under age 71, call Graduate Pediatric Dentistry at [phone]. Children aged 39-14, please call [phone] to request a pediatric application.  Dental services are provided in all areas of  dental care including fillings, crowns and bridges, complete and partial dentures, implants, gum treatment, root canals, and extractions. Preventive care is also provided. Treatment is provided to both adults and children. Patients are selected via a lottery and there is often a waiting list.   [facility] [address] [place]  [phone] [email or web address], [place], [place] [phone], Ext. [phone] Second and Fourth Thursday of each month, opens at 6:30  AM; Clinic ends at 9 AM.  Patients are seen on a first-come first-served basis, and a limited number are seen during each clinic.   [facility]  [address] Hillard Danker [place], [place] [phone]   Eligibility Requirements You must have lived in [place], [place], or [place] counties for at least the last three months.   You cannot be eligible for state or federal sponsored [employer], including [employer], [place], or [employer].   You generally cannot be eligible for healthcare insurance through your employer.    How to apply: Eligibility screenings are held every Tuesday and Wednesday afternoon from 1:00 pm until 4:00 pm. You do not need an appointment for the interview!  [facility] [address] [place], [place]   [place]  [place] Department  [place]  [phone]    Behavioral Health Resources in the Community: Intensive Outpatient Programs Organization         Address  Phone  Notes  [place]. [address] [place], [place] [phone]   [facility] Outpatient [address] [place], [place]   ADS: Alcohol & Drug Svcs [address], [place], [place]   [place] [address],  [place], [place] or  [phone]   Substance Abuse Resources Organization         Address  Phone  Notes  Alcohol and Drug Services  [phone]   [place]  [phone]   [facility] [place]   Chinita Pester  [phone]   Residential & Outpatient Substance Abuse Program  [phone]   Psychological Services Organization         Address  Phone  Notes  [facility] [place]  [place]  [phone]   [place] [address] [place] or [phone]    Mobile Crisis Teams Organization         Address  Phone  Notes  [facility], Mobile Crisis Care Unit  [phone]   Assertive Psychotherapeutic Services  [address] [place], [place]   [place] Levels [address] [place] [phone]    Self-Help/Support Groups Organization         Address  Phone             Notes  [place]. of [place] - variety of support groups  [place] Call for more information  Narcotics Anonymous (NA), Caring Services [address], [employer] [place]  2 meetings at this location   Special educational needs teacher         Address  Phone  Notes  ASAP Residential Treatment [place],    [place]  [phone]   [facility]  [address] [place] [id number], [place], [place]   [place], [place] [phone] Admissions: 8am-3pm M-F  Incentives Substance [place] [address],    [place], [place] [phone]   [facility] [address] Jadene Pierini [place], [place]   [facility] [address]  [place], [place]   Insight Programs - Intensive Outpatient [address] Kristeen [address], [place], [place]   [facility] ([place] [address] [place], [place] or [phone]  Residential  Treatment Services (RTS) [address] [place], [place] Accepts Medicaid  [facility] [place] [address],  [place] [phone] Substance Abuse/Addiction Treatment   [facility] Organization         Address  Phone  Notes  [facility]  [phone]   Domenic Schwab, PhD [address] Arlis Porta [place], [place]   [phone] or [phone]   [place], [place] [phone]   [place], [place], [place] [phone] Insurance/Medicaid/sponsorship through [facility] and Families [address], [address] [place]                                    [place], [place] [phone] [place] [address], [place] [phone]    Dr. Adele Schilder  [phone]   [facility] Dept. 1) [address] [place] 2) [place] 3)  [place] [address], [place] [phone]  [phone]   [place] [phone] or [phone] (After Hours)
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
## 2015-04-19 ENCOUNTER — Encounter (HOSPITAL_COMMUNITY): Payer: Self-pay

## 2015-04-19 ENCOUNTER — Emergency Department (HOSPITAL_COMMUNITY): Payer: Managed Care, Other (non HMO)

## 2015-04-19 ENCOUNTER — Emergency Department (HOSPITAL_COMMUNITY)
Admission: EM | Admit: 2015-04-19 | Discharge: 2015-04-19 | Disposition: A | Discharge status: Home / Self Care | Payer: Managed Care, Other (non HMO) | Attending: Emergency Medicine | Admitting: Emergency Medicine

## 2015-04-19 DIAGNOSIS — G43909 Migraine, unspecified, not intractable, without status migrainosus: Secondary | ICD-10-CM | POA: Insufficient documentation

## 2015-04-19 DIAGNOSIS — Z8709 Personal history of other diseases of the respiratory system: Secondary | ICD-10-CM | POA: Diagnosis not present

## 2015-04-19 DIAGNOSIS — S0083XA Contusion of other part of head, initial encounter: Secondary | ICD-10-CM | POA: Insufficient documentation

## 2015-04-19 DIAGNOSIS — Y998 Other external cause status: Secondary | ICD-10-CM | POA: Insufficient documentation

## 2015-04-19 DIAGNOSIS — Y9289 Other specified places as the place of occurrence of the external cause: Secondary | ICD-10-CM | POA: Diagnosis not present

## 2015-04-19 DIAGNOSIS — Z8719 Personal history of other diseases of the digestive system: Secondary | ICD-10-CM | POA: Diagnosis not present

## 2015-04-19 DIAGNOSIS — Z87891 Personal history of nicotine dependence: Secondary | ICD-10-CM | POA: Insufficient documentation

## 2015-04-19 DIAGNOSIS — Y9389 Activity, other specified: Secondary | ICD-10-CM | POA: Insufficient documentation

## 2015-04-19 DIAGNOSIS — S0990XA Unspecified injury of head, initial encounter: Secondary | ICD-10-CM | POA: Diagnosis present

## 2015-04-19 DIAGNOSIS — Z791 Long term (current) use of non-steroidal anti-inflammatories (NSAID): Secondary | ICD-10-CM | POA: Insufficient documentation

## 2015-04-19 DIAGNOSIS — T7491XA Unspecified adult maltreatment, confirmed, initial encounter: Secondary | ICD-10-CM

## 2015-04-19 MED ORDER — IBUPROFEN 400 MG PO TABS
800.0000 mg | ORAL_TABLET | Freq: Once | ORAL | Status: AC
  Administered 2015-04-19: 800 mg via ORAL
  Filled 2015-04-19: qty 2

## 2015-04-19 MED ORDER — HYDROCODONE-ACETAMINOPHEN 5-325 MG PO TABS: 1.0000 | ORAL_TABLET | Freq: Four times a day (QID) | ORAL | Status: DC | PRN

## 2015-04-19 MED ORDER — NAPROXEN 500 MG PO TABS: 500.0000 mg | ORAL_TABLET | Freq: Two times a day (BID) | ORAL | Status: DC
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
## 2015-04-25 ENCOUNTER — Emergency Department (HOSPITAL_COMMUNITY)
Admission: EM | Admit: 2015-04-25 | Discharge: 2015-04-25 | Disposition: A | Discharge status: Home / Self Care | Payer: Managed Care, Other (non HMO) | Attending: Emergency Medicine | Admitting: Emergency Medicine

## 2015-04-25 ENCOUNTER — Encounter (HOSPITAL_COMMUNITY): Payer: Self-pay | Admitting: *Deleted

## 2015-04-25 DIAGNOSIS — Y9389 Activity, other specified: Secondary | ICD-10-CM | POA: Insufficient documentation

## 2015-04-25 DIAGNOSIS — Y998 Other external cause status: Secondary | ICD-10-CM | POA: Insufficient documentation

## 2015-04-25 DIAGNOSIS — Y92410 Unspecified street and highway as the place of occurrence of the external cause: Secondary | ICD-10-CM | POA: Diagnosis not present

## 2015-04-25 DIAGNOSIS — G43909 Migraine, unspecified, not intractable, without status migrainosus: Secondary | ICD-10-CM | POA: Insufficient documentation

## 2015-04-25 DIAGNOSIS — Z87891 Personal history of nicotine dependence: Secondary | ICD-10-CM | POA: Diagnosis not present

## 2015-04-25 DIAGNOSIS — Z8709 Personal history of other diseases of the respiratory system: Secondary | ICD-10-CM | POA: Diagnosis not present

## 2015-04-25 DIAGNOSIS — S0990XA Unspecified injury of head, initial encounter: Secondary | ICD-10-CM | POA: Diagnosis present

## 2015-04-25 DIAGNOSIS — Z791 Long term (current) use of non-steroidal anti-inflammatories (NSAID): Secondary | ICD-10-CM | POA: Diagnosis not present

## 2015-04-25 DIAGNOSIS — Z8719 Personal history of other diseases of the digestive system: Secondary | ICD-10-CM | POA: Diagnosis not present

## 2015-04-25 MED ORDER — ACETAMINOPHEN 325 MG PO TABS
650.0000 mg | ORAL_TABLET | Freq: Once | ORAL | Status: AC
  Administered 2015-04-25: 650 mg via ORAL
  Filled 2015-04-25: qty 2

## 2015-04-25 NOTE — Discharge Instructions (Signed)
You may take Tylenol or ibuprofen as prescribed over-the-counter as needed for pain relief. I also recommend using ice for 15-20 minutes 3-4 times daily as needed for swelling. Please follow up with a primary care provider from the Resource Guide provided below in 5-7 days as needed. Please return to the Emergency Department if symptoms worsen or new onset of fever, visual changes, lightheadedness, dizziness, numbness, tingling, weakness, syncope, seizure.   Emergency Department Resource Guide 1) Find a Doctor and Pay Out of Pocket Although you won't have to find out who is covered by your insurance plan, it is a good idea to ask around and get recommendations. You will then need to call the office and see if the doctor you have chosen will accept you as a new patient and what types of options they offer for patients who are self-pay. Some doctors offer discounts or will set up payment plans for their patients who do not have insurance, but you will need to ask so you aren't surprised when you get to your appointment.  2) Contact Your Local Health Department Not all health departments have doctors that can see patients for sick visits, but many do, so it is worth a call to see if yours does. If you don't know where your local health department is, you can check in your phone book. The CDC also has a tool to help you locate your state's health department, and many state websites also have listings of all of their local health departments.  3) Find a Heath Clinic If your illness is not likely to be very severe or complicated, you may want to try a walk in clinic. These are popping up all over the country in pharmacies, drugstores, and shopping centers. They're usually staffed by nurse practitioners or physician assistants that have been trained to treat common illnesses and complaints. They're usually fairly quick and inexpensive. However, if you have serious medical issues or chronic medical problems,  these are probably not your best option.  No Primary Care Doctor: - Call Health Connect at  [phone] - they can help you locate a primary care doctor that  accepts your insurance, provides certain services, etc. - Physician Referral Service- [phone]  Chronic Pain Problems: Organization         Address  Phone   Notes  [facility]  [phone] Patients need to be referred by their primary care doctor.   Medication Assistance: Organization         Address  Phone   Notes  [facility] Medication [facility] [address] [place], [place] [address] [phone] --Must be a resident of [facility] -- Must have NO insurance coverage whatsoever (no Medicaid/ Medicare, etc.) -- The pt. MUST have a primary care doctor that directs their care regularly and follows them in the community   Med[facility]ist  [phone]   [employer]  [phone]    Agencies that provide inexpensive medical care: Organization         Address  Phone   Notes  [place]  [phone]   [facility]    [phone]   [facility] [place], [place] [address] [phone]   [place] [phone]   Planned Parenthood    [phone]   [facility]    [phone]   Community Health and  [address], [place] Phone:  [phone], Fax:  [phone] Hours of Operation:  9 am - 6 pm, M-F.  Also accepts Medicaid/Medicare and self-pay.  [facility] for [place], [address], [place] Phone: [phone], Fax: [phone]. Hours of Operation:  8:30 am - 5:30 pm, M-F.  Also accepts Medicaid and self-pay.  [facility] [address], [place] Phone: [phone]   [place], [place], [place] [phone], Ext. [phone] Mondays &  Thursdays: 7-9 AM.  First 15 patients are seen on a first come, first serve basis.    [place] Providers:  Organization         Address  Phone   Notes  [facility] [address], [place] [phone] Also accepts self-pay patients.  [facility] [id number] [place], [place]  [phone]   [place], [address], [place] [phone]   [facility] [address] [place] [phone]   Lucianne Lei [address], [place]   [phone] Only accepts [place] Access [place] patients after they have their name applied to their card.   Self-Pay (no insurance) in [facility]:  Organization         Address  Phone   Notes  Sickle Cell Patients, [facility] [place] [phone]   [facility] [place] [phone]   [facility] [place]  [place], [place], [place] [phone]   [facility]/Dr. Osei-Bonsu  [address] [place] or [address], [place] [phone] Phone number for both [place] and [place] and [place] locations is the same.  Urgent Medical and [facility] [address] [place] [phone]   [facility] [address], [place] or [address] [phone]   [facility] [address] [place] [phone], phone; [phone], fax Sees patients 1st and 3rd Saturday of every month.  Must not qualify for public or private insurance (i.e. Medicaid, Medicare, [place] Health Choice, Veterans' Benefits)  Household income should be no more than 200% of the poverty level The clinic cannot treat you if you are pregnant or think you are pregnant  Sexually transmitted diseases are not treated at the  clinic.    Dental Care: Organization         Address  Phone  Notes  [facility] Department of [facility] [place] [phone] Accepts children up to age 20 who are enrolled in [place] or [place]; pregnant women with a Medicaid card; and children who have applied for Medicaid or  Health Choice, but were declined, whose parents can pay a reduced fee at time of service.  [facility] Department of [facility]  [address], [place] [phone] Accepts children up to age 72 who are enrolled in [place] or [place]; pregnant women with a Medicaid card; and children who have applied for Medicaid or  Health Choice, but were declined, whose parents can pay a reduced fee at time of service.  [facility]  [place],  [place] [phone] Patients are seen by appointment only. Walk-ins are not accepted. [place] will see patients 79 years of age and older. Monday - Tuesday (8am-5pm) Most Wednesdays (8:30-5pm) $30 per visit, cash only  [facility] Adult [facility] Access PRO[facility]  [address], [facility] [phone] Patients are seen by appointment only. Walk-ins are not accepted. Center will see patients 50 years of age and older. One Wednesday Evening (Monthly: Volunteer Based).  $30 per visit, cash only  [place]  [phone] for adults; Children under age 22, call Graduate Pediatric Dentistry at [phone]. Children aged 59-14, please call [phone] to request a pediatric application.  Dental services are provided in all areas of dental care including fillings, crowns and bridges, complete and partial dentures, implants, gum treatment, root canals, and extractions. Preventive care is also provided. Treatment is provided to both adults and children. Patients are selected via a lottery and there is often a  waiting list.   [facility] [address], [place]  [phone] www.drcivils.com   [facility] [address] [place], [place] [phone], Ext. [phone] Second and Fourth Thursday of each month, opens at 6:30 AM; Clinic ends at 9 AM.  Patients are seen on a first-come first-served basis, and a limited number are seen during each clinic.   [facility]  [address] Hillard Danker [place], [place] [phone]   Eligibility Requirements You must have lived in [place], [place], or [place] counties for at least the last three months.   You cannot be eligible for state or federal sponsored [employer], including [employer], [place], or [employer].   You generally cannot be eligible for healthcare insurance through your employer.    How to apply: Eligibility screenings are held every Tuesday and Wednesday afternoon from 1:00 pm until 4:00 pm. You do not need an appointment for the interview!  [facility] [address] [place], [place]   [place]  [place] Department  [place]  [phone]    Behavioral Health Resources in the Community: Intensive Outpatient Programs Organization         Address  Phone  Notes  [place]. [address] [place], [place] [phone]   [facility] Outpatient [address], [place], [place]   ADS: Alcohol & Drug Svcs [address], [place], [place]   [place] [address],  [place], [place] or [phone]   Substance Abuse Resources Organization         Address  Phone  Notes  Alcohol and Drug Services  [phone]   [place]  [phone]   The [place]   Chinita Pester  [phone]   Residential & Outpatient Substance Abuse  Program  [phone]   Psychological Services Organization         Address  Phone  Notes  [facility] [place]  [place]  [phone]   [place] [address], [place] or [phone]    Mobile Crisis Teams Organization         Address  Phone  Notes  [facility], Mobile Crisis Care Unit  [phone]   [facility]  [address] [place], [place]   [place] Levels [phone]  [address], [address] [place] [phone]    Self-Help/Support Groups Organization         Address  Phone             Notes  [facility] [place] - variety of support groups  [place]ennette Call for more information  Narcotics Anonymous (NA), [facility] [address], [employer] [place]  2 meetings at this location   Special educational needs teacher         Address  Phone  Notes  [facility]idential Treatment [place],    [place]  [phone]   [facility]  [address], [place] [id number], [place], [place]   [place], [place] [phone] Admissions: 8am-3pm M-F  Incentives Substance [place] [address]    [place], [place] [phone]   [facility] [address] [place], [place], [place]   [facility] [address],  [place], [place]   [facility] Programs - Intensive Outpatient [address] Kristeen [address], [place], [place]   [facility] ([place].,  [place], [place] [phone] or [phone]   [facility] (RTS) [address], [place], [place] Accepts Medicaid  [facility] [place] [address]  [place] [phone] Substance Abuse/Addiction Treatment   [facility] Organization          Address  Phone  Notes  [facility]  [phone]   Domenic Schwab, PhD [address] Arlis [place], [place]   [phone] or [phone]   [place], [place] [phone]   [facility] [address] [place], [place] [phone] Insurance/Medicaid/sponsorship through [facility] and Families [address] [place]                                    [place], [place] [phone] [place] [address], [place] [phone]    Dr. Adele Schilder  [phone]   [facility] Dept. 1) [address] [place] 2) [place] 3)  [address], [place] [phone]  [phone]   [place] [phone] or [phone] (After Hours)
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
## 2015-04-25 NOTE — ED Notes (Signed)
Patient is alert and orientedx4.  Patient was explained discharge instructions and they understood them with no questions.   

## 2015-07-31 ENCOUNTER — Emergency Department (HOSPITAL_COMMUNITY)
Admission: EM | Admit: 2015-07-31 | Discharge: 2015-07-31 | Disposition: A | Discharge status: Home / Self Care | Payer: Managed Care, Other (non HMO) | Attending: Emergency Medicine | Admitting: Emergency Medicine

## 2015-07-31 ENCOUNTER — Encounter (HOSPITAL_COMMUNITY): Payer: Self-pay

## 2015-07-31 DIAGNOSIS — Z87891 Personal history of nicotine dependence: Secondary | ICD-10-CM | POA: Insufficient documentation

## 2015-07-31 DIAGNOSIS — Y999 Unspecified external cause status: Secondary | ICD-10-CM | POA: Insufficient documentation

## 2015-07-31 DIAGNOSIS — W182XXA Fall in (into) shower or empty bathtub, initial encounter: Secondary | ICD-10-CM | POA: Insufficient documentation

## 2015-07-31 DIAGNOSIS — Y939 Activity, unspecified: Secondary | ICD-10-CM | POA: Insufficient documentation

## 2015-07-31 DIAGNOSIS — S46811A Strain of other muscles, fascia and tendons at shoulder and upper arm level, right arm, initial encounter: Secondary | ICD-10-CM | POA: Insufficient documentation

## 2015-07-31 DIAGNOSIS — Y929 Unspecified place or not applicable: Secondary | ICD-10-CM | POA: Insufficient documentation

## 2015-07-31 MED ORDER — CYCLOBENZAPRINE HCL 10 MG PO TABS: 10.0000 mg | ORAL_TABLET | Freq: Three times a day (TID) | ORAL | Status: DC

## 2015-07-31 MED ORDER — DICLOFENAC SODIUM 75 MG PO TBEC: 75.0000 mg | DELAYED_RELEASE_TABLET | Freq: Two times a day (BID) | ORAL | Status: DC

## 2015-07-31 NOTE — ED Provider Notes (Signed)
CSN: [id number]     Arrival date & time 07/31/15  1340 History  By signing my name below, I, Diana Serrano, attest that this documentation has been prepared under the direction and in the presence of Lily Kocher, PA-C. Electronically Signed: Rayna Serrano, ED Scribe. 07/31/2015. 4:14 PM.   Chief Complaint  Patient presents with  . Neck Pain   Patient is a 30 y.o. female presenting with neck pain. The history is provided by the patient. No language interpreter was used.  Neck Pain Pain location:  R side Pain radiates to:  R shoulder Pain severity:  Mild Pain is:  Same all the time Onset quality:  Sudden Timing:  Constant Progression:  Worsening Chronicity:  New Context: fall   Worsened by:  Bending, position and twisting Ineffective treatments:  None tried Associated symptoms: no syncope     HPI Comments: Diana Serrano is a 30 y.o. female who presents to the Emergency Department complaining of sudden onset, mild, right neck and shoulder pain onset earlier today. Pt slipped in a bath tub that was covered in baby oil and caught herself from falling resulting in her current symptoms. Her pain worsens with movement. She denies head trauma, LOC or any other associated symptoms at this time.   Past Medical History  Diagnosis Date  . Migraine   . Bronchitis   . GERD (gastroesophageal reflux disease)    History reviewed. No pertinent past surgical history. Family History  Problem Relation Age of Onset  . Colon polyps Mother   . Colon cancer Mother     died age 30  . Diabetes Paternal Grandfather   . Esophageal cancer Neg Hx   . Rectal cancer Neg Hx   . Stomach cancer Neg Hx    Social History  Substance Use Topics  . Smoking status: Former Smoker    Quit date: 06/01/2012  . Smokeless tobacco: Never Used  . Alcohol Use: 1.2 oz/week    2 Shots of liquor per week     Comment: OCCAS- VODKA   OB History    Gravida Para Term Preterm AB TAB SAB Ectopic Multiple Living   3     3 1 2    0     Review of Systems  Cardiovascular: Negative for syncope.  Musculoskeletal: Positive for arthralgias and neck pain.  Neurological: Negative for syncope.  All other systems reviewed and are negative.  Allergies  Review of patient's allergies indicates no known allergies.  Home Medications   Prior to Admission medications   Medication Sig Start Date End Date Taking? Authorizing Provider  cyclobenzaprine (FLEXERIL) 5 MG tablet Take 1 tablet (5 mg total) by mouth 3 (three) times daily as needed for muscle spasms. 04/12/15   Evalee Jefferson, PA-C  HYDROcodone-acetaminophen (NORCO) 5-325 MG tablet Take 1 tablet by mouth every 6 (six) hours as needed. 04/19/15   Hope Bunnie Pion, NP  ibuprofen (ADVIL,MOTRIN) 800 MG tablet Take 1 tablet (800 mg total) by mouth 3 (three) times daily. 04/12/15   Evalee Jefferson, PA-C  naproxen (NAPROSYN) 500 MG tablet Take 1 tablet (500 mg total) by mouth 2 (two) times daily. 04/19/15   Hope Bunnie Pion, NP   BP 124/85 mmHg  Pulse 91  Temp(Src) 97.4 F (36.3 C) (Oral)  Resp 18  Ht 5\' 4"  (1.626 m)  Wt 250 lb (113.399 kg)  BMI 42.89 kg/m2  SpO2 100%  LMP 07/02/2015    Physical Exam  Constitutional: She is oriented to person, place, and  time. She appears well-developed and well-nourished.  HENT:  Head: Normocephalic and atraumatic.  Eyes: EOM are normal.  Neck: Normal range of motion.  Cardiovascular: Normal rate.   Pulmonary/Chest: Effort normal. No respiratory distress.  Abdominal: Soft.  Musculoskeletal: Normal range of motion.  FROM of bilateral fingers, wrists, elbows and shoulders. No evidence of right shoulder dislocation.   No palpable step off in C or T spine. FROM of the neck.   Neurological: She is alert and oriented to person, place, and time.  Grip, sensory and strength symmetrical.   Skin: Skin is warm and dry.  Psychiatric: She has a normal mood and affect.  Nursing note and vitals reviewed.   ED Course  Procedures  DIAGNOSTIC  STUDIES: Oxygen Saturation is 100% on RA, normal by my interpretation.    COORDINATION OF CARE: 3:57 PM Discussed next steps with pt. She verbalized understanding and is agreeable with the plan.   Labs Review Labs Reviewed - No data to display  Imaging Review No results found.   EKG Interpretation None      MDM  Vital signs reviewed. The examination favors trapezius strain. No evidence for deformity or dislocation. No neurovascular deficit. Pt will use flexeril and diclofenac. Pt will follow up with orthopedics if not improving.   Final diagnoses:  Trapezius muscle strain, right, initial encounter    *I personally performed the services described in this documentation, which was scribed in my presence. The recorded information has been reviewed and is accurate.** **I have reviewed nursing notes, vital signs, and all appropriate lab and imaging results for this patient.Lily Kocher, PA-C 07/31/15 2009  Milton Ferguson, MD 08/01/15 [phone]

## 2015-07-31 NOTE — Discharge Instructions (Signed)
Muscle Strain °A muscle strain (pulled muscle) happens when a muscle is stretched beyond normal length. It happens when a sudden, violent force stretches your muscle too far. Usually, a few of the fibers in your muscle are torn. Muscle strain is common in athletes. Recovery usually takes 1-2 weeks. Complete healing takes 5-6 weeks.  °HOME CARE  °· Follow the PRICE method of treatment to help your injury get better. Do this the first 2-3 days after the injury: °¨ Protect. Protect the muscle to keep it from getting injured again. °¨ Rest. Limit your activity and rest the injured body part. °¨ Ice. Put ice in a plastic bag. Place a towel between your skin and the bag. Then, apply the ice and leave it on from 15-20 minutes each hour. After the third day, switch to moist heat packs. °¨ Compression. Use a splint or elastic bandage on the injured area for comfort. Do not put it on too tightly. °¨ Elevate. Keep the injured body part above the level of your heart. °· Only take medicine as told by your doctor. °· Warm up before doing exercise to prevent future muscle strains. °GET HELP IF:  °· You have more pain or puffiness (swelling) in the injured area. °· You feel numbness, tingling, or notice a loss of strength in the injured area. °MAKE SURE YOU:  °· Understand these instructions. °· Will watch your condition. °· Will get help right away if you are not doing well or get worse. °  °This information is not intended to replace advice given to you by your health care provider. Make sure you discuss any questions you have with your health care provider. °  °Document Released: 12/12/2007 Document Revised: 12/23/2012 Document Reviewed: 10/01/2012 °Elsevier Interactive Patient Education ©2016 [employer] ° °

## 2015-07-31 NOTE — ED Notes (Signed)
Pt verbalized understanding of no driving and to use caution within 4 hours of taking pain meds due to meds cause drowsiness 

## 2015-07-31 NOTE — ED Notes (Signed)
Pt reports that she slipped in tub this morning injuring right neck and shoulder area trying to catch herself

## 2015-08-09 ENCOUNTER — Emergency Department (HOSPITAL_COMMUNITY)
Admission: EM | Admit: 2015-08-09 | Discharge: 2015-08-09 | Disposition: A | Discharge status: Home / Self Care | Payer: Managed Care, Other (non HMO) | Attending: Emergency Medicine | Admitting: Emergency Medicine

## 2015-08-09 ENCOUNTER — Encounter (HOSPITAL_COMMUNITY): Payer: Self-pay | Admitting: *Deleted

## 2015-08-09 ENCOUNTER — Emergency Department (HOSPITAL_COMMUNITY): Payer: Managed Care, Other (non HMO)

## 2015-08-09 DIAGNOSIS — Y9389 Activity, other specified: Secondary | ICD-10-CM | POA: Insufficient documentation

## 2015-08-09 DIAGNOSIS — S0990XA Unspecified injury of head, initial encounter: Secondary | ICD-10-CM | POA: Diagnosis present

## 2015-08-09 DIAGNOSIS — Y9289 Other specified places as the place of occurrence of the external cause: Secondary | ICD-10-CM | POA: Insufficient documentation

## 2015-08-09 DIAGNOSIS — W228XXA Striking against or struck by other objects, initial encounter: Secondary | ICD-10-CM | POA: Diagnosis not present

## 2015-08-09 DIAGNOSIS — Z87891 Personal history of nicotine dependence: Secondary | ICD-10-CM | POA: Insufficient documentation

## 2015-08-09 DIAGNOSIS — Y990 Civilian activity done for income or pay: Secondary | ICD-10-CM | POA: Diagnosis not present

## 2015-08-09 LAB — PREGNANCY, URINE: PREG TEST UR: NEGATIVE

## 2015-08-09 MED ORDER — ONDANSETRON HCL 4 MG PO TABS: 4.0000 mg | ORAL_TABLET | Freq: Three times a day (TID) | ORAL | Status: DC | PRN

## 2015-08-09 MED ORDER — NAPROXEN 250 MG PO TABS: 250.0000 mg | ORAL_TABLET | Freq: Two times a day (BID) | ORAL | Status: DC | PRN

## 2015-08-09 NOTE — ED Notes (Signed)
Patient walked to the bathroom with minimal assistance.  

## 2015-08-09 NOTE — ED Provider Notes (Signed)
CSN: [id number]     Arrival date & time 08/09/15  1903 History   First MD Initiated Contact with Patient 08/09/15 1927     Chief Complaint  Patient presents with  . Head Injury      HPI Pt was seen at [place]. Per pt, c/o sudden onset and resolution of one episode of head injury that occurred yesterday while she was at work. Pt states she stood up and hit the front of her head on a cabinet. Pt states she had N/V immediately after the incident, and has continued to feel nauseated and have a headache. Denies LOC, no AMS, no neck or back pain, no CP/SOB, no abd pain, no black or blood in emesis, no diarrhea, no focal motor weakness, no tingling/numbness in extremities, no ataxia, no slurred speech.    Past Medical History  Diagnosis Date  . Migraine   . Bronchitis   . GERD (gastroesophageal reflux disease)    History reviewed. No pertinent past surgical history.   Family History  Problem Relation Age of Onset  . Colon polyps Mother   . Colon cancer Mother     died age 95  . Diabetes Paternal Grandfather   . Esophageal cancer Neg Hx   . Rectal cancer Neg Hx   . Stomach cancer Neg Hx    Social History  Substance Use Topics  . Smoking status: Former Smoker    Quit date: 06/01/2012  . Smokeless tobacco: Never Used  . Alcohol Use: 1.2 oz/week    2 Shots of liquor per week     Comment: OCCAS- VODKA   OB History    Gravida Para Term Preterm AB TAB SAB Ectopic Multiple Living   3    3 1 2    0     Review of Systems ROS: Statement: All systems negative except as marked or noted in the HPI; Constitutional: Negative for fever and chills. ; ; Eyes: Negative for eye pain, redness and discharge. ; ; ENMT: Negative for ear pain, hoarseness, nasal congestion, sinus pressure and sore throat. ; ; Cardiovascular: Negative for chest pain, palpitations, diaphoresis, dyspnea and peripheral edema. ; ; Respiratory: Negative for cough, wheezing and stridor. ; ; Gastrointestinal: +N/V. Negative for  diarrhea, abdominal pain, blood in stool, hematemesis, jaundice and rectal bleeding. . ; ; Genitourinary: Negative for dysuria, flank pain and hematuria. ; ; Musculoskeletal: +head injury. Negative for back pain and neck pain. Negative for swelling and deformity.; ; Skin: Negative for pruritus, rash, abrasions, blisters, bruising and skin lesion.; ; Neuro: +headache. Negative for lightheadedness and neck stiffness. Negative for weakness, altered level of consciousness, altered mental status, extremity weakness, paresthesias, involuntary movement, seizure and syncope.      Allergies  Review of patient's allergies indicates no known allergies.  Home Medications   Prior to Admission medications   Medication Sig Start Date End Date Taking? Authorizing Provider  cyclobenzaprine (FLEXERIL) 10 MG tablet Take 1 tablet (10 mg total) by mouth 3 (three) times daily. 07/31/15   Lily Kocher, PA-C  diclofenac (VOLTAREN) 75 MG EC tablet Take 1 tablet (75 mg total) by mouth 2 (two) times daily. 07/31/15   Lily Kocher, PA-C  HYDROcodone-acetaminophen (NORCO) 5-325 MG tablet Take 1 tablet by mouth every 6 (six) hours as needed. 04/19/15   Hope Bunnie Pion, NP  ibuprofen (ADVIL,MOTRIN) 800 MG tablet Take 1 tablet (800 mg total) by mouth 3 (three) times daily. 04/12/15   Evalee Jefferson, PA-C  naproxen (NAPROSYN) 500 MG tablet  Take 1 tablet (500 mg total) by mouth 2 (two) times daily. 04/19/15   Hope Bunnie Pion, NP   BP 111/79 mmHg  Pulse 85  Temp(Src) 98 F (36.7 C) (Oral)  Resp 18  Ht 5\' 4"  (1.626 m)  Wt 250 lb (113.399 kg)  BMI 42.89 kg/m2  SpO2 99%  LMP 07/02/2015 Physical Exam 1935: Physical examination:  Nursing notes reviewed; Vital signs and O2 SAT reviewed;  Constitutional: Well developed, Well nourished, Well hydrated, In no acute distress; Head:  Normocephalic, atraumatic; Eyes: EOMI, PERRL, No scleral icterus; ENMT: TM's clear bilat. Mouth and pharynx normal, Mucous membranes moist; Neck: Supple, Full range of  motion, No lymphadenopathy; Cardiovascular: Regular rate and rhythm, No murmur, rub, or gallop; Respiratory: Breath sounds clear & equal bilaterally, No rales, rhonchi, wheezes.  Speaking full sentences with ease, Normal respiratory effort/excursion; Chest: Nontender, Movement normal; Abdomen: Soft, Nontender, Nondistended, Normal bowel sounds; Spine:  No midline CS, TS, LS tenderness.;; Genitourinary: No CVA tenderness; Extremities: Pulses normal, No tenderness, No edema, No calf edema or asymmetry.; Neuro: AA&Ox3, Major CN grossly intact.  Speech clear. No gross focal motor or sensory deficits in extremities. Climbs on and off stretcher easily by herself. Gait steady.; Skin: Color normal, Warm, Dry.   ED Course  Procedures (including critical care time) Labs Review  Imaging Review  I have personally reviewed and evaluated these images and lab results as part of my medical decision-making.   EKG Interpretation None      MDM  MDM Reviewed: previous chart, nursing note and vitals Interpretation: CT scan   Ct Head Wo Contrast 08/09/2015  CLINICAL DATA:  Hit head on cabinet, with vomiting and nausea. Initial encounter. EXAM: CT HEAD WITHOUT CONTRAST TECHNIQUE: Contiguous axial images were obtained from the base of the skull through the vertex without intravenous contrast. COMPARISON:  CT of the head performed 03/24/2015 FINDINGS: There is no evidence of acute infarction, mass lesion, or intra- or extra-axial hemorrhage on CT. The posterior fossa, including the cerebellum, brainstem and fourth ventricle, is within normal limits. The third and lateral ventricles, and basal ganglia are unremarkable in appearance. The cerebral hemispheres are symmetric in appearance, with normal gray-white differentiation. No mass effect or midline shift is seen. There is no evidence of fracture; visualized osseous structures are unremarkable in appearance. The visualized portions of the orbits are within normal  limits. The paranasal sinuses and mastoid air cells are well-aerated. No significant soft tissue abnormalities are seen. IMPRESSION: No evidence of traumatic intracranial injury or fracture. Electronically Signed   By: Garald Balding M.D.   On: 08/09/2015 21:39    2145:  Pt has ambulated with steady gait, easy resps, NAD. CT reassuring. Tx symptomatically, f/u PMD. Dx and testing d/w pt.  Questions answered.  Verb understanding, agreeable to d/c home with outpt f/u.   Francine Graven, DO 08/12/15 [phone]

## 2015-08-09 NOTE — ED Notes (Signed)
Pt states she was at work and when she raised her head she hit the cabinet; pt states she vomited x 1 after incident and has been nauseous since; pt denies any loc

## 2015-08-09 NOTE — Discharge Instructions (Signed)
Take the prescriptions as directed. No gym or sports for the next 2 weeks, and seen in follow up by your regular medical doctor.  Call your regular medical doctor tomorrow to schedule a follow up appointment within the next 3 days.  Return to the Emergency Department immediately sooner if worsening.

## 2016-03-18 NOTE — L&D Delivery Note (Signed)
Patient is a 31 y.o. now G4P1 s/p NSVD at [redacted]w[redacted]d, who was admitted for SOL.  Delivery Note At 1:12 PM a viable female was delivered via Vaginal, Spontaneous (Presentation: cephalic ;LOA  ).  APGAR: 8, 9; weight pending .   Placenta status: intact, .  Cord: 3 vessel  with the following complications: none.    Anesthesia: none  Episiotomy: None Lacerations: 2nd degree Suture Repair: 3.0 vicryl Est. Blood Loss (mL): 250  Mom to postpartum.  Baby to Couplet care / Skin to Skin.  Head delivered LOA. Loose nuchal cord x 1 present, easily reduced. Shoulder and body delivered in usual fashion. Infant with spontaneous cry, placed on mother's abdomen, dried and bulb suctioned. Cord clamped x 2 after 1-minute delay, and cut by family member. Cord blood drawn. Placenta delivered spontaneously with gentle cord traction. Fundus firm with massage and Pitocin. Perineum inspected and found to have 2nd degree laceration, which was repaired with 3.0 vicryl with good hemostasis achieved.  Lucilla Edin. Anabia Weatherwax,  MD Family Medicine Resident PGY-1 01/27/17, 1:36 PM

## 2016-06-27 LAB — OB RESULTS CONSOLE HIV ANTIBODY (ROUTINE TESTING): HIV: NONREACTIVE

## 2016-06-27 LAB — OB RESULTS CONSOLE GC/CHLAMYDIA
Chlamydia: NEGATIVE
Gonorrhea: NEGATIVE

## 2016-06-27 LAB — OB RESULTS CONSOLE ABO/RH: RH Type: POSITIVE

## 2016-06-27 LAB — OB RESULTS CONSOLE RUBELLA ANTIBODY, IGM: Rubella: IMMUNE

## 2016-06-27 LAB — OB RESULTS CONSOLE RPR: RPR: NONREACTIVE

## 2016-06-27 LAB — OB RESULTS CONSOLE HEPATITIS B SURFACE ANTIGEN: HEP B S AG: NEGATIVE

## 2016-06-27 LAB — OB RESULTS CONSOLE ANTIBODY SCREEN: Antibody Screen: NEGATIVE

## 2016-09-06 ENCOUNTER — Encounter (HOSPITAL_COMMUNITY): Payer: Self-pay | Admitting: *Deleted

## 2016-09-06 ENCOUNTER — Inpatient Hospital Stay (HOSPITAL_COMMUNITY)
Admission: AD | Admit: 2016-09-06 | Discharge: 2016-09-06 | Disposition: A | Discharge status: Home / Self Care | Payer: BLUE CROSS/BLUE SHIELD | Source: Ambulatory Visit | Attending: Family Medicine | Admitting: Family Medicine

## 2016-09-06 DIAGNOSIS — O98312 Other infections with a predominantly sexual mode of transmission complicating pregnancy, second trimester: Secondary | ICD-10-CM | POA: Insufficient documentation

## 2016-09-06 DIAGNOSIS — Z87891 Personal history of nicotine dependence: Secondary | ICD-10-CM | POA: Insufficient documentation

## 2016-09-06 DIAGNOSIS — N760 Acute vaginitis: Secondary | ICD-10-CM | POA: Diagnosis present

## 2016-09-06 DIAGNOSIS — O99612 Diseases of the digestive system complicating pregnancy, second trimester: Secondary | ICD-10-CM | POA: Insufficient documentation

## 2016-09-06 DIAGNOSIS — A5901 Trichomonal vulvovaginitis: Secondary | ICD-10-CM | POA: Diagnosis present

## 2016-09-06 DIAGNOSIS — O9989 Other specified diseases and conditions complicating pregnancy, childbirth and the puerperium: Secondary | ICD-10-CM | POA: Diagnosis not present

## 2016-09-06 DIAGNOSIS — B9689 Other specified bacterial agents as the cause of diseases classified elsewhere: Secondary | ICD-10-CM | POA: Diagnosis not present

## 2016-09-06 DIAGNOSIS — O23592 Infection of other part of genital tract in pregnancy, second trimester: Secondary | ICD-10-CM

## 2016-09-06 DIAGNOSIS — K219 Gastro-esophageal reflux disease without esophagitis: Secondary | ICD-10-CM | POA: Insufficient documentation

## 2016-09-06 DIAGNOSIS — Z3A20 20 weeks gestation of pregnancy: Secondary | ICD-10-CM | POA: Insufficient documentation

## 2016-09-06 HISTORY — DX: Other specified bacterial agents as the cause of diseases classified elsewhere: B96.89

## 2016-09-06 HISTORY — DX: Acute vaginitis: N76.0

## 2016-09-06 LAB — WET PREP, GENITAL
SPERM: NONE SEEN
Yeast Wet Prep HPF POC: NONE SEEN

## 2016-09-06 LAB — URINALYSIS, ROUTINE W REFLEX MICROSCOPIC
BILIRUBIN URINE: NEGATIVE
GLUCOSE, UA: NEGATIVE mg/dL
Hgb urine dipstick: NEGATIVE
Ketones, ur: NEGATIVE mg/dL
Leukocytes, UA: NEGATIVE
Nitrite: NEGATIVE
Protein, ur: NEGATIVE mg/dL
SPECIFIC GRAVITY, URINE: 1.016 (ref 1.005–1.030)
pH: 6 (ref 5.0–8.0)

## 2016-09-06 MED ORDER — METRONIDAZOLE 0.75 % VA GEL: 1.0000 | Freq: Every day | VAGINAL | 0 refills | Status: DC

## 2016-09-06 MED ORDER — METRONIDAZOLE 500 MG PO TABS: 500.0000 mg | ORAL_TABLET | Freq: Two times a day (BID) | ORAL | 0 refills | Status: AC

## 2016-09-06 MED ORDER — METRONIDAZOLE 500 MG PO TABS
2000.0000 mg | ORAL_TABLET | ORAL | Status: AC
  Administered 2016-09-06: 2000 mg via ORAL
  Filled 2016-09-06: qty 4

## 2016-09-06 MED ORDER — METRONIDAZOLE 500 MG PO TABS: 2000.0000 mg | ORAL_TABLET | ORAL | Status: DC

## 2016-09-06 NOTE — Discharge Instructions (Signed)
Your partner needs to be notified of your positive Trichomoniasis.  He will need to be treated at the health department or with his primary care provider.

## 2016-09-06 NOTE — MAU Note (Signed)
Pt C/O vaginal pain since this morning, denies discharge.  No bleeding or LOF.

## 2016-09-06 NOTE — MAU Provider Note (Signed)
History     CSN: [id number]  Arrival date and time: 09/06/16 1626   First Provider Initiated Contact with Patient 09/06/16 1711      Chief Complaint  Patient presents with  . Vaginal Pain   HPI  Diana Serrano is a 31 yo G4P0030 at 20.[redacted] wks gestation presenting with complaints of vaginal pain that started when she woke up this AM.  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  She feels this may have contributed to her current complaints.  She has a new sexual partner x 2 months; using no protection from STIs.  Past Medical History:  Diagnosis Date  . Bronchitis   . GERD (gastroesophageal reflux disease)   . Migraine     History reviewed. No pertinent surgical history.  Family History  Problem Relation Age of Onset  . Colon polyps Mother   . Colon cancer Mother        died age 5  . Diabetes Paternal Grandfather   . Esophageal cancer Neg Hx   . Rectal cancer Neg Hx   . Stomach cancer Neg Hx     Social History  Substance Use Topics  . Smoking status: Former Smoker    Quit date: 06/01/2012  . Smokeless tobacco: Never Used  . Alcohol use No     Comment: OCCAS- VODKA    Allergies: No Known Allergies  Prescriptions Prior to Admission  Medication Sig Dispense Refill Last Dose  . cyclobenzaprine (FLEXERIL) 10 MG tablet Take 1 tablet (10 mg total) by mouth 3 (three) times daily. 20 tablet 0 Past Week at Unknown time  . diclofenac (VOLTAREN) 75 MG EC tablet Take 1 tablet (75 mg total) by mouth 2 (two) times daily. 14 tablet 0 Past Week at Unknown time  . naproxen (NAPROSYN) 250 MG tablet Take 1 tablet (250 mg total) by mouth 2 (two) times daily as needed for mild pain or moderate pain (take with food). 14 tablet 0   . ondansetron (ZOFRAN) 4 MG tablet Take 1 tablet (4 mg total) by mouth every 8 (eight) hours as needed for nausea or vomiting. 6 tablet 0     Review of Systems  Constitutional: Negative.   HENT: Negative.   Eyes: Negative.    Respiratory: Negative.   Cardiovascular: Negative.   Gastrointestinal: Negative.   Endocrine: Negative.   Genitourinary: Positive for vaginal pain.       (+) FM; mostly at night  Musculoskeletal: Negative.   Skin: Negative.   Allergic/Immunologic: Negative.   Neurological: Negative.   Hematological: Negative.   Psychiatric/Behavioral: Negative.    Physical Exam   Blood pressure 105/62, pulse 96, temperature 97.5 F (36.4 C), temperature source Oral, resp. rate 18, height 5\' 4"  (1.626 m), weight 106.6 kg (235 lb).  Physical Exam  Constitutional: She appears well-developed and well-nourished.  HENT:  Head: Normocephalic.  Eyes: Pupils are equal, round, and reactive to light.  Neck: Normal range of motion.  Cardiovascular: Normal rate, regular rhythm, normal heart sounds and intact distal pulses.   Respiratory: Effort normal and breath sounds normal.  GI: Soft. Bowel sounds are normal.  Genitourinary: Pelvic exam was performed with patient supine. Vaginal discharge found.  Genitourinary Comments: Uterus: Gravid, S>D, cx; smooth, pink, no lesions, small amt of thick, white d/c, closed/long/firm, no CMT or friability, no adnexal tenderness    Skin: Skin is warm and dry.  Psychiatric: She has a normal mood and affect. Her behavior is  normal. Judgment and thought content normal.    MAU Course  Procedures  MDM CCAU CBC GC/CT HIV Flagyl 2 gm po now - per pt request - declined Flagyl 500 mg BID x 7 days)  FHR by Doppler: 153 bpm  Results for orders placed or performed during the hospital encounter of 09/06/16 (from the past 24 hour(s))  Urinalysis, Routine w reflex microscopic     Status: Abnormal   Collection Time: 09/06/16  4:45 PM  Result Value Ref Range   Color, Urine YELLOW YELLOW   APPearance HAZY (A) CLEAR   Specific Gravity, Urine 1.016 1.005 - 1.030   pH 6.0 5.0 - 8.0   Glucose, UA NEGATIVE NEGATIVE mg/dL   Hgb urine dipstick NEGATIVE NEGATIVE   Bilirubin  Urine NEGATIVE NEGATIVE   Ketones, ur NEGATIVE NEGATIVE mg/dL   Protein, ur NEGATIVE NEGATIVE mg/dL   Nitrite NEGATIVE NEGATIVE   Leukocytes, UA NEGATIVE NEGATIVE  Wet prep, genital     Status: Abnormal   Collection Time: 09/06/16  5:34 PM  Result Value Ref Range   Yeast Wet Prep HPF POC NONE SEEN NONE SEEN   Trich, Wet Prep PRESENT (A) NONE SEEN   Clue Cells Wet Prep HPF POC PRESENT (A) NONE SEEN   WBC, Wet Prep HPF POC MODERATE (A) NONE SEEN   Sperm NONE SEEN    Assessment and Plan  Bacterial vaginitis - Rx for Metrogel 0.75% pv hs x 5 days (per pt request) - Instructions on BV given  Trichomonal vaginitis during pregnancy in second trimester - Tx with Flagyl 2 gm po now (per pt request) - Advised not to have sexual contact with partner until he has been treated - Recommend partner be [facility] at [facility]  Discharge home Patient verbalized an understanding of the plan of care and agrees.   Laury Deep, MSN, CNM 09/06/2016, 5:40 PM

## 2016-09-07 LAB — HIV ANTIBODY (ROUTINE TESTING W REFLEX): HIV SCREEN 4TH GENERATION: NONREACTIVE

## 2016-09-09 LAB — GC/CHLAMYDIA PROBE AMP (~~LOC~~) NOT AT ARMC
Chlamydia: NEGATIVE
NEISSERIA GONORRHEA: NEGATIVE

## 2016-09-19 ENCOUNTER — Other Ambulatory Visit (HOSPITAL_COMMUNITY): Payer: Self-pay | Admitting: Nurse Practitioner

## 2016-09-19 DIAGNOSIS — Z3689 Encounter for other specified antenatal screening: Secondary | ICD-10-CM

## 2016-10-01 ENCOUNTER — Encounter (HOSPITAL_COMMUNITY): Payer: Self-pay

## 2016-10-01 ENCOUNTER — Ambulatory Visit (HOSPITAL_COMMUNITY)
Admission: RE | Admit: 2016-10-01 | Discharge: 2016-10-01 | Disposition: A | Discharge status: Home / Self Care | Payer: BLUE CROSS/BLUE SHIELD | Source: Ambulatory Visit | Attending: Nurse Practitioner | Admitting: Nurse Practitioner

## 2016-10-01 ENCOUNTER — Other Ambulatory Visit (HOSPITAL_COMMUNITY): Payer: Self-pay | Admitting: Nurse Practitioner

## 2016-10-01 DIAGNOSIS — Z3A24 24 weeks gestation of pregnancy: Secondary | ICD-10-CM | POA: Insufficient documentation

## 2016-10-01 DIAGNOSIS — Z3689 Encounter for other specified antenatal screening: Secondary | ICD-10-CM

## 2016-10-01 DIAGNOSIS — O99212 Obesity complicating pregnancy, second trimester: Secondary | ICD-10-CM

## 2016-10-15 ENCOUNTER — Other Ambulatory Visit (HOSPITAL_COMMUNITY): Payer: Self-pay | Admitting: Nurse Practitioner

## 2016-10-15 DIAGNOSIS — Z3689 Encounter for other specified antenatal screening: Secondary | ICD-10-CM

## 2016-10-23 ENCOUNTER — Inpatient Hospital Stay (HOSPITAL_COMMUNITY)
Admission: AD | Admit: 2016-10-23 | Discharge: 2016-10-23 | Disposition: A | Discharge status: Home / Self Care | Payer: BLUE CROSS/BLUE SHIELD | Source: Ambulatory Visit | Attending: Obstetrics and Gynecology | Admitting: Obstetrics and Gynecology

## 2016-10-23 ENCOUNTER — Encounter (HOSPITAL_COMMUNITY): Payer: Self-pay

## 2016-10-23 DIAGNOSIS — Z3A27 27 weeks gestation of pregnancy: Secondary | ICD-10-CM | POA: Diagnosis not present

## 2016-10-23 DIAGNOSIS — O9989 Other specified diseases and conditions complicating pregnancy, childbirth and the puerperium: Secondary | ICD-10-CM

## 2016-10-23 DIAGNOSIS — O26892 Other specified pregnancy related conditions, second trimester: Secondary | ICD-10-CM | POA: Insufficient documentation

## 2016-10-23 DIAGNOSIS — R109 Unspecified abdominal pain: Secondary | ICD-10-CM | POA: Diagnosis not present

## 2016-10-23 DIAGNOSIS — N760 Acute vaginitis: Secondary | ICD-10-CM

## 2016-10-23 DIAGNOSIS — O23592 Infection of other part of genital tract in pregnancy, second trimester: Secondary | ICD-10-CM

## 2016-10-23 DIAGNOSIS — Z87891 Personal history of nicotine dependence: Secondary | ICD-10-CM | POA: Insufficient documentation

## 2016-10-23 DIAGNOSIS — A5901 Trichomonal vulvovaginitis: Secondary | ICD-10-CM

## 2016-10-23 DIAGNOSIS — R102 Pelvic and perineal pain: Secondary | ICD-10-CM | POA: Diagnosis present

## 2016-10-23 DIAGNOSIS — N949 Unspecified condition associated with female genital organs and menstrual cycle: Secondary | ICD-10-CM

## 2016-10-23 DIAGNOSIS — B9689 Other specified bacterial agents as the cause of diseases classified elsewhere: Secondary | ICD-10-CM

## 2016-10-23 LAB — URINALYSIS, ROUTINE W REFLEX MICROSCOPIC
BILIRUBIN URINE: NEGATIVE
Glucose, UA: NEGATIVE mg/dL
Hgb urine dipstick: NEGATIVE
KETONES UR: NEGATIVE mg/dL
Leukocytes, UA: NEGATIVE
NITRITE: NEGATIVE
PH: 6 (ref 5.0–8.0)
Protein, ur: NEGATIVE mg/dL
Specific Gravity, Urine: 1.014 (ref 1.005–1.030)

## 2016-10-23 MED ORDER — CYCLOBENZAPRINE HCL 10 MG PO TABS: 10.0000 mg | ORAL_TABLET | Freq: Two times a day (BID) | ORAL | 0 refills | Status: DC | PRN

## 2016-10-23 NOTE — MAU Note (Signed)
Pt here with c/o pain in "vagina and bottom" started today. 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

## 2016-10-23 NOTE — Discharge Instructions (Signed)

## 2016-10-23 NOTE — MAU Provider Note (Signed)
  History     CSN: [id number]  Arrival date and time: 10/23/16 2012   First Provider Initiated Contact with Patient 10/23/16 2234      Chief Complaint  Patient presents with  . Pelvic Pain   HPI Diana Serrano is a 31yo [id number] @ 27.2wks who presents for eval of vaginal discomfort that began after dinner tonight. Thinks she may have 'pulled something' but doesn't recall a specific incident. She is a CNA and assists with moving pts. Denies leaking or bldg. No ctx or dysuria. No fever or back pain. Her preg has been followed by the [facility] and has been essentially unremarkable.  OB History    Gravida Para Term Preterm AB Living   4       3 0   SAB TAB Ectopic Multiple Live Births   2 1            Past Medical History:  Diagnosis Date  . Bacterial vaginitis 09/06/2016  . Bronchitis   . GERD (gastroesophageal reflux disease)   . Migraine     History reviewed. No pertinent surgical history.  Family History  Problem Relation Age of Onset  . Colon polyps Mother   . Colon cancer Mother        died age 33  . Diabetes Paternal Grandfather   . Esophageal cancer Neg Hx   . Rectal cancer Neg Hx   . Stomach cancer Neg Hx     Social History  Substance Use Topics  . Smoking status: Former Smoker    Quit date: 06/01/2012  . Smokeless tobacco: Never Used  . Alcohol use No     Comment: OCCAS- VODKA    Allergies: No Known Allergies  Prescriptions Prior to Admission  Medication Sig Dispense Refill Last Dose  . acetaminophen (TYLENOL) 325 MG tablet Take 650 mg by mouth every 6 (six) hours as needed for moderate pain.   10/22/2016 at Unknown time  . Prenatal Vit-Fe Fumarate-FA (PRENATAL MULTIVITAMIN) TABS tablet Take 1 tablet by mouth daily at 12 noon.   10/23/2016 at Unknown time  . ranitidine (ZANTAC) 150 MG tablet Take 150 mg by mouth daily as needed for heartburn.   Past Week at Unknown time    Review of Systems No other pertinents other than what is listed in HPI  Physical Exam   Blood  pressure 110/71, pulse 93, temperature 98.4 F (36.9 C), temperature source Oral, resp. rate 19, SpO2 100 %.  Physical Exam  Constitutional: She is oriented to person, place, and time. She appears well-developed.  HENT:  Head: Normocephalic.  Neck: Normal range of motion.  Cardiovascular: Normal rate.   Respiratory: Effort normal.  GI:  EFM 130s, +accels, no decels No ctx per toco  Genitourinary: Vagina normal.  Genitourinary Comments: Cx post/C/L  Musculoskeletal: Normal range of motion.  Neurological: She is alert and oriented to person, place, and time.  Skin: Skin is warm and dry.  Psychiatric: She has a normal mood and affect. Her behavior is normal. Thought content normal.    MAU Course  Procedures  MDM UA ordered  Assessment and Plan  IUP@27 .2wks Ligament pain  D/C home with comfort tips Rec maternity support belt Rx Flexeril 10mg  #20 Keep next scheduled visit  Serita Grammes CNM 10/23/2016, 10:45 PM

## 2016-10-29 ENCOUNTER — Ambulatory Visit (HOSPITAL_COMMUNITY): Payer: BLUE CROSS/BLUE SHIELD

## 2016-10-29 ENCOUNTER — Ambulatory Visit (HOSPITAL_COMMUNITY)
Admission: RE | Admit: 2016-10-29 | Discharge: 2016-10-29 | Disposition: A | Discharge status: Home / Self Care | Payer: BLUE CROSS/BLUE SHIELD | Source: Ambulatory Visit | Attending: Nurse Practitioner | Admitting: Nurse Practitioner

## 2016-10-29 ENCOUNTER — Encounter (HOSPITAL_COMMUNITY): Payer: Self-pay

## 2016-10-29 DIAGNOSIS — O99212 Obesity complicating pregnancy, second trimester: Secondary | ICD-10-CM | POA: Insufficient documentation

## 2016-10-29 DIAGNOSIS — Z362 Encounter for other antenatal screening follow-up: Secondary | ICD-10-CM | POA: Diagnosis not present

## 2016-10-29 DIAGNOSIS — Z6841 Body Mass Index (BMI) 40.0 and over, adult: Secondary | ICD-10-CM | POA: Diagnosis not present

## 2016-10-29 DIAGNOSIS — Z3689 Encounter for other specified antenatal screening: Secondary | ICD-10-CM | POA: Insufficient documentation

## 2016-10-29 DIAGNOSIS — Z3A28 28 weeks gestation of pregnancy: Secondary | ICD-10-CM | POA: Insufficient documentation

## 2016-10-29 NOTE — Addendum Note (Signed)
Encounter addended by: Ladora Daniel E on: 10/29/2016 12:42 PM<BR>    Actions taken: Imaging Exam ended

## 2016-12-20 LAB — OB RESULTS CONSOLE GC/CHLAMYDIA
CHLAMYDIA, DNA PROBE: NEGATIVE
Gonorrhea: NEGATIVE

## 2016-12-20 LAB — OB RESULTS CONSOLE GBS: GBS: NEGATIVE

## 2017-01-21 ENCOUNTER — Telehealth (HOSPITAL_COMMUNITY): Payer: Self-pay | Admitting: *Deleted

## 2017-01-21 NOTE — Telephone Encounter (Signed)
Preadmission screen  

## 2017-01-23 ENCOUNTER — Encounter (HOSPITAL_COMMUNITY): Payer: Self-pay

## 2017-01-23 ENCOUNTER — Inpatient Hospital Stay (HOSPITAL_COMMUNITY)
Admission: AD | Admit: 2017-01-23 | Discharge: 2017-01-23 | Disposition: A | Discharge status: Home / Self Care | Payer: BLUE CROSS/BLUE SHIELD | Source: Ambulatory Visit | Attending: Obstetrics & Gynecology | Admitting: Obstetrics & Gynecology

## 2017-01-23 DIAGNOSIS — O479 False labor, unspecified: Secondary | ICD-10-CM

## 2017-01-23 DIAGNOSIS — O471 False labor at or after 37 completed weeks of gestation: Secondary | ICD-10-CM | POA: Diagnosis present

## 2017-01-23 DIAGNOSIS — Z3A41 41 weeks gestation of pregnancy: Secondary | ICD-10-CM | POA: Diagnosis not present

## 2017-01-23 DIAGNOSIS — N760 Acute vaginitis: Secondary | ICD-10-CM

## 2017-01-23 DIAGNOSIS — A5901 Trichomonal vulvovaginitis: Secondary | ICD-10-CM

## 2017-01-23 DIAGNOSIS — O23592 Infection of other part of genital tract in pregnancy, second trimester: Secondary | ICD-10-CM

## 2017-01-23 DIAGNOSIS — B9689 Other specified bacterial agents as the cause of diseases classified elsewhere: Secondary | ICD-10-CM

## 2017-01-23 NOTE — MAU Note (Signed)
I have communicated with Dr. Garlan Fillers and reviewed vital signs:  Vitals:   01/23/17 1550 01/23/17 1615  BP:  107/72  Pulse:  93  Resp:  18  Temp:    SpO2: 99%     Vaginal exam:  Dilation: Closed Effacement (%): Thick Cervical Position: Posterior Station: -3 Presentation: Vertex Exam by:: Wilhemena Durie RN,   Also reviewed contraction pattern and that non-stress test is reactive.  It has been documented that patient is having irregular contractions and cervix is closed and posterior not indicating active labor.  Patient denies any other complaints.  Based on this report provider has given order for discharge.  A discharge order and diagnosis entered by a provider.   Labor discharge instructions reviewed with patient.

## 2017-01-23 NOTE — MAU Note (Signed)
Pt reports ctx on and off since 3am. Denies any vag bleeding or leaking. Good fetal movement reported.

## 2017-01-26 ENCOUNTER — Encounter (HOSPITAL_COMMUNITY): Payer: Self-pay

## 2017-01-26 ENCOUNTER — Other Ambulatory Visit: Payer: Self-pay

## 2017-01-26 ENCOUNTER — Inpatient Hospital Stay (HOSPITAL_COMMUNITY)
Admission: AD | Admit: 2017-01-26 | Discharge: 2017-01-26 | Disposition: A | Discharge status: Home / Self Care | Payer: BLUE CROSS/BLUE SHIELD | Source: Ambulatory Visit | Attending: Obstetrics & Gynecology | Admitting: Obstetrics & Gynecology

## 2017-01-26 DIAGNOSIS — O479 False labor, unspecified: Secondary | ICD-10-CM

## 2017-01-26 NOTE — Discharge Instructions (Signed)

## 2017-01-26 NOTE — MAU Note (Signed)
I have communicated with Dr. Manus Rudd and reviewed vital signs:  Vitals:   01/26/17 2100 01/26/17 2134  BP: 126/80   Pulse: 90   Resp: 18   Temp: 98.2 F (36.8 C)   SpO2: 97% 97%    Vaginal exam:  Dilation: 1.5 Effacement (%): 60 Cervical Position: Middle Station: -3 Presentation: Vertex Exam by:: [employer] RN,   Also reviewed contraction pattern and that non-stress test is reactive.  It has been documented that patient is contracting irregularly with no cervical change over 2+ hours not indicating active labor.  Patient denies any other complaints.  Based on this report provider has given order for discharge.  A discharge order and diagnosis entered by a provider.   Labor discharge instructions reviewed with patient.

## 2017-01-26 NOTE — MAU Note (Signed)
Urine sent to lab 

## 2017-01-26 NOTE — Progress Notes (Addendum)
G4P0 @ 40.[redacted] wksga. Presents to triage for r/o labor. Denies LOF or bleeding. +FM  VSS see flow sheet for details  EFM applied  SVE 1.5/60/-3  2145: Provider notified. Report status of pt given.Provider made aware of strip and reviewed. Orders received to monitor for another hr.   2121: relinquished care over to [employer].

## 2017-01-27 ENCOUNTER — Inpatient Hospital Stay (HOSPITAL_COMMUNITY)
Admission: AD | Admit: 2017-01-27 | Discharge: 2017-01-29 | DRG: 807 | Disposition: A | Discharge status: Home / Self Care | Payer: BLUE CROSS/BLUE SHIELD | Source: Ambulatory Visit | Attending: Obstetrics and Gynecology | Admitting: Obstetrics and Gynecology

## 2017-01-27 ENCOUNTER — Encounter (HOSPITAL_COMMUNITY): Payer: Self-pay

## 2017-01-27 DIAGNOSIS — O99214 Obesity complicating childbirth: Secondary | ICD-10-CM | POA: Diagnosis present

## 2017-01-27 DIAGNOSIS — O9962 Diseases of the digestive system complicating childbirth: Secondary | ICD-10-CM | POA: Diagnosis present

## 2017-01-27 DIAGNOSIS — D259 Leiomyoma of uterus, unspecified: Secondary | ICD-10-CM | POA: Diagnosis present

## 2017-01-27 DIAGNOSIS — O3413 Maternal care for benign tumor of corpus uteri, third trimester: Secondary | ICD-10-CM | POA: Diagnosis present

## 2017-01-27 DIAGNOSIS — Z87891 Personal history of nicotine dependence: Secondary | ICD-10-CM | POA: Diagnosis not present

## 2017-01-27 DIAGNOSIS — Z3483 Encounter for supervision of other normal pregnancy, third trimester: Secondary | ICD-10-CM | POA: Diagnosis present

## 2017-01-27 DIAGNOSIS — O480 Post-term pregnancy: Secondary | ICD-10-CM

## 2017-01-27 DIAGNOSIS — Z3A41 41 weeks gestation of pregnancy: Secondary | ICD-10-CM | POA: Diagnosis not present

## 2017-01-27 DIAGNOSIS — E669 Obesity, unspecified: Secondary | ICD-10-CM | POA: Diagnosis present

## 2017-01-27 DIAGNOSIS — K219 Gastro-esophageal reflux disease without esophagitis: Secondary | ICD-10-CM | POA: Diagnosis present

## 2017-01-27 LAB — CBC
HEMATOCRIT: 40 % (ref 36.0–46.0)
HEMOGLOBIN: 13.7 g/dL (ref 12.0–15.0)
MCH: 29.5 pg (ref 26.0–34.0)
MCHC: 34.3 g/dL (ref 30.0–36.0)
MCV: 86 fL (ref 78.0–100.0)
Platelets: 285 10*3/uL (ref 150–400)
RBC: 4.65 MIL/uL (ref 3.87–5.11)
RDW: 16.4 % — ABNORMAL HIGH (ref 11.5–15.5)
WBC: 8.7 10*3/uL (ref 4.0–10.5)

## 2017-01-27 LAB — TYPE AND SCREEN
ABO/RH(D): O POS
Antibody Screen: NEGATIVE

## 2017-01-27 LAB — ABO/RH: ABO/RH(D): O POS

## 2017-01-27 MED ORDER — COCONUT OIL OIL: 1.0000 "application " | TOPICAL_OIL | Status: DC | PRN

## 2017-01-27 MED ORDER — PRENATAL MULTIVITAMIN CH
1.0000 | ORAL_TABLET | Freq: Every day | ORAL | Status: DC
  Administered 2017-01-28 – 2017-01-29 (×2): 1 via ORAL
  Filled 2017-01-27 (×2): qty 1

## 2017-01-27 MED ORDER — OXYTOCIN 40 UNITS IN LACTATED RINGERS INFUSION - SIMPLE MED
INTRAVENOUS | Status: AC
  Filled 2017-01-27: qty 1000

## 2017-01-27 MED ORDER — ACETAMINOPHEN 325 MG PO TABS: 650.0000 mg | ORAL_TABLET | ORAL | Status: DC | PRN

## 2017-01-27 MED ORDER — DIPHENHYDRAMINE HCL 25 MG PO CAPS: 25.0000 mg | ORAL_CAPSULE | Freq: Four times a day (QID) | ORAL | Status: DC | PRN

## 2017-01-27 MED ORDER — WITCH HAZEL-GLYCERIN EX PADS: 1.0000 "application " | MEDICATED_PAD | CUTANEOUS | Status: DC | PRN

## 2017-01-27 MED ORDER — LACTATED RINGERS IV SOLN
INTRAVENOUS | Status: DC
  Administered 2017-01-27: 12:00:00 via INTRAVENOUS

## 2017-01-27 MED ORDER — SIMETHICONE 80 MG PO CHEW: 80.0000 mg | CHEWABLE_TABLET | ORAL | Status: DC | PRN

## 2017-01-27 MED ORDER — IBUPROFEN 600 MG PO TABS
600.0000 mg | ORAL_TABLET | Freq: Four times a day (QID) | ORAL | Status: DC
  Administered 2017-01-27 – 2017-01-29 (×9): 600 mg via ORAL
  Filled 2017-01-27 (×9): qty 1

## 2017-01-27 MED ORDER — OXYCODONE-ACETAMINOPHEN 5-325 MG PO TABS
1.0000 | ORAL_TABLET | ORAL | Status: DC | PRN
Start: 2017-01-27 — End: 2017-01-27

## 2017-01-27 MED ORDER — BENZOCAINE-MENTHOL 20-0.5 % EX AERO
1.0000 "application " | INHALATION_SPRAY | CUTANEOUS | Status: DC | PRN
  Administered 2017-01-27: 1 via TOPICAL
  Filled 2017-01-27: qty 56

## 2017-01-27 MED ORDER — OXYTOCIN 40 UNITS IN LACTATED RINGERS INFUSION - SIMPLE MED
2.5000 [IU]/h | INTRAVENOUS | Status: DC
  Administered 2017-01-27: 2.5 [IU]/h via INTRAVENOUS

## 2017-01-27 MED ORDER — OXYCODONE-ACETAMINOPHEN 5-325 MG PO TABS: 2.0000 | ORAL_TABLET | ORAL | Status: DC | PRN

## 2017-01-27 MED ORDER — ONDANSETRON HCL 4 MG/2ML IJ SOLN: 4.0000 mg | INTRAMUSCULAR | Status: DC | PRN

## 2017-01-27 MED ORDER — ONDANSETRON HCL 4 MG/2ML IJ SOLN: 4.0000 mg | Freq: Four times a day (QID) | INTRAMUSCULAR | Status: DC | PRN

## 2017-01-27 MED ORDER — SOD CITRATE-CITRIC ACID 500-334 MG/5ML PO SOLN: 30.0000 mL | ORAL | Status: DC | PRN

## 2017-01-27 MED ORDER — SENNOSIDES-DOCUSATE SODIUM 8.6-50 MG PO TABS
2.0000 | ORAL_TABLET | ORAL | Status: DC
  Administered 2017-01-28 (×2): 2 via ORAL
  Filled 2017-01-27 (×2): qty 2

## 2017-01-27 MED ORDER — LIDOCAINE HCL (PF) 1 % IJ SOLN
INTRAMUSCULAR | Status: AC
  Administered 2017-01-27: 30 mL
  Filled 2017-01-27: qty 30

## 2017-01-27 MED ORDER — LACTATED RINGERS IV SOLN: 500.0000 mL | INTRAVENOUS | Status: DC | PRN

## 2017-01-27 MED ORDER — FENTANYL CITRATE (PF) 100 MCG/2ML IJ SOLN: 100.0000 ug | INTRAMUSCULAR | Status: DC | PRN

## 2017-01-27 MED ORDER — ZOLPIDEM TARTRATE 5 MG PO TABS: 5.0000 mg | ORAL_TABLET | Freq: Every evening | ORAL | Status: DC | PRN

## 2017-01-27 MED ORDER — OXYTOCIN BOLUS FROM INFUSION
500.0000 mL | Freq: Once | INTRAVENOUS | Status: AC
  Administered 2017-01-27: 500 mL via INTRAVENOUS

## 2017-01-27 MED ORDER — TETANUS-DIPHTH-ACELL PERTUSSIS 5-2.5-18.5 LF-MCG/0.5 IM SUSP: 0.5000 mL | Freq: Once | INTRAMUSCULAR | Status: DC

## 2017-01-27 MED ORDER — DIBUCAINE 1 % RE OINT: 1.0000 "application " | TOPICAL_OINTMENT | RECTAL | Status: DC | PRN

## 2017-01-27 MED ORDER — LIDOCAINE HCL (PF) 1 % IJ SOLN
30.0000 mL | INTRAMUSCULAR | Status: DC | PRN
  Filled 2017-01-27: qty 30

## 2017-01-27 MED ORDER — ONDANSETRON HCL 4 MG PO TABS: 4.0000 mg | ORAL_TABLET | ORAL | Status: DC | PRN

## 2017-01-27 NOTE — MAU Note (Signed)
Min[place], MD called back after reviewing FHR tracing and feels as if the U/S is tracing maternal heart rate during contractions. MD requested that I move the pulse ox to other hand and continue to monitor. RN voiced concern over audible decrease in FHR after contraction.

## 2017-01-27 NOTE — Lactation Note (Signed)
This note was copied from a baby's chart. Lactation Consultation Note  Patient Name: Diana Serrano [id number] Date: 01/27/2017 Reason for consult: Initial assessment;Primapara;Term;1st time breastfeeding Breastfeeding consultation services and support information given and reviewed. This is mom's first baby and newborn is 28 hours old.  Baby is currently latched well and feeding actively.  Reviewed waking techniques and breast massage.   Instructed to feed with any cue and call for assist prn.  Maternal Data    Feeding Feeding Type: Breast Fed  LATCH Score Latch: Grasps breast easily, tongue down, lips flanged, rhythmical sucking.  Audible Swallowing: A few with stimulation  Type of Nipple: Everted at rest and after stimulation  Comfort (Breast/Nipple): Soft / non-tender  Hold (Positioning): No assistance needed to correctly position infant at breast.  LATCH Score: 9  Interventions Interventions: Breast feeding basics reviewed;Skin to skin;Breast massage;Support pillows  Lactation Tools Discussed/Used     Consult Status Consult Status: Follow-up Date: 01/28/17 Follow-up type: In-patient    Ave Filter 01/27/2017, 6:24 PM

## 2017-01-27 NOTE — Plan of Care (Signed)
  Progressing Education: Knowledge of General Education information will improve 01/27/2017 1836 - Progressing by Rod Can, RN Health Behavior/Discharge Planning: Ability to manage health-related needs will improve 01/27/2017 1836 - Progressing by Rod Can, RN Clinical Measurements: Ability to maintain clinical measurements within normal limits will improve 01/27/2017 1836 - Progressing by Rod Can, RN Will remain free from infection 01/27/2017 1836 - Progressing by Rod Can, RN Diagnostic test results will improve 01/27/2017 1836 - Progressing by Rod Can, RN Respiratory complications will improve 01/27/2017 1836 - Progressing by Rod Can, RN Cardiovascular complication will be avoided 01/27/2017 1836 - Progressing by Rod Can, RN Activity: Risk for activity intolerance will decrease 01/27/2017 1836 - Progressing by Rod Can, RN Coping: Level of anxiety will decrease 01/27/2017 1836 - Progressing by Rod Can, RN Pain Managment: General experience of comfort will improve 01/27/2017 1836 - Progressing by Rod Can, RN Skin Integrity: Risk for impaired skin integrity will decrease 01/27/2017 1836 - Progressing by Rod Can, RN Education: Knowledge of condition will improve 01/27/2017 1836 - Progressing by Rod Can, RN Activity: Will verbalize the importance of balancing activity with adequate rest periods 01/27/2017 1836 - Progressing by Rod Can, RN Ability to tolerate increased activity will improve 01/27/2017 1836 - Progressing by Rod Can, RN Role Relationship: Ability to demonstrate positive interaction with newborn will improve 01/27/2017 1836 - Progressing by Rod Can, RN
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
## 2017-01-27 NOTE — MAU Note (Signed)
Notified Resident (Minott, MD) about FHR change, Pt not tolerating contractions well and FHR decelerations after contractions. Asked for provider to review tracing/come to MAU to see pt due to holding.

## 2017-01-27 NOTE — MAU Note (Signed)
Pt presents to MAU with c/o contractions that started at 3 am today. Pt has had no vaginal bleeding and no LOF. +FM

## 2017-01-28 LAB — RPR: RPR Ser Ql: NONREACTIVE

## 2017-01-28 NOTE — Progress Notes (Signed)
POSTPARTUM PROGRESS NOTE  Post Partum Day 1 Subjective:  Diana Serrano is a 31 y.o. O3F2902 [redacted]w[redacted]d s/p NSVD.  No acute events overnight.  Pt denies problems with ambulating, voiding or po intake.  She denies nausea or vomiting.  Pain is well controlled.  Lochia Minimal.   Objective: Blood pressure 110/68, pulse 95, temperature 97.9 F (36.6 C), temperature source Oral, resp. rate 18, last menstrual period 04/01/2016, SpO2 100 %, unknown if currently breastfeeding.  Physical Exam:  General: alert, cooperative and no distress Lochia:normal flow Chest: no respiratory distress Heart:regular rate, distal pulses intact Abdomen: soft, nontender,  Uterine Fundus: firm, appropriately tender DVT Evaluation: No calf swelling or tenderness Extremities: no edema  Recent Labs    01/27/17 1150  HGB 13.7  HCT 40.0    Assessment/Plan:  ASSESSMENT: Diana Serrano is a 31 y.o. [id number] [redacted]w[redacted]d s/p NSVD.  Plan for discharge tomorrow   LOS: 1 day   Nomar Broad MossMD 01/28/2017, 7:19 AM

## 2017-01-28 NOTE — Plan of Care (Signed)
Pt. Was sleeping upon entering room at 0730 this morning.  When renetering room at 0830, patient was awake and feeling rested.  She complains of no pain, only very mild soreness.
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
## 2017-01-29 ENCOUNTER — Inpatient Hospital Stay (HOSPITAL_COMMUNITY): Payer: BLUE CROSS/BLUE SHIELD

## 2017-01-29 MED ORDER — IBUPROFEN 600 MG PO TABS: 600.0000 mg | ORAL_TABLET | Freq: Four times a day (QID) | ORAL | 1 refills | Status: AC | PRN

## 2017-01-29 MED ORDER — SENNOSIDES-DOCUSATE SODIUM 8.6-50 MG PO TABS: 2.0000 | ORAL_TABLET | Freq: Every evening | ORAL | 0 refills | Status: DC | PRN

## 2017-01-29 NOTE — Discharge Instructions (Signed)
Postpartum Care After Vaginal Delivery °The period of time right after you deliver your newborn is called the postpartum period. °What kind of medical care will I receive? °· You may continue to receive fluids and medicines through an IV tube inserted into one of your veins. °· If an incision was made near your vagina (episiotomy) or if you had some vaginal tearing during delivery, cold compresses may be placed on your episiotomy or your tear. This helps to reduce pain and swelling. °· You may be given a squirt bottle to use when you go to the bathroom. You may use this until you are comfortable wiping as usual. To use the squirt bottle, follow these steps: °? Before you urinate, fill the squirt bottle with warm water. Do not use hot water. °? After you urinate, while you are sitting on the toilet, use the squirt bottle to rinse the area around your urethra and vaginal opening. This rinses away any urine and blood. °? You may do this instead of wiping. As you start healing, you may use the squirt bottle before wiping yourself. Make sure to wipe gently. °? Fill the squirt bottle with clean water every time you use the bathroom. °· You will be given sanitary pads to wear. °How can I expect to feel? °· You may not feel the need to urinate for several hours after delivery. °· You will have some soreness and pain in your abdomen and vagina. °· If you are breastfeeding, you may have uterine contractions every time you breastfeed for up to several weeks postpartum. Uterine contractions help your uterus return to its normal size. °· It is normal to have vaginal bleeding (lochia) after delivery. The amount and appearance of lochia is often similar to a menstrual period in the first week after delivery. It will gradually decrease over the next few weeks to a dry, yellow-brown discharge. For most women, lochia stops completely by 6-8 weeks after delivery. Vaginal bleeding can vary from woman to woman. °· Within the first few  days after delivery, you may have breast engorgement. This is when your breasts feel heavy, full, and uncomfortable. Your breasts may also throb and feel hard, tightly stretched, warm, and tender. After this occurs, you may have milk leaking from your breasts. Your health care provider can help you relieve discomfort due to breast engorgement. Breast engorgement should go away within a few days. °· You may feel more sad or worried than normal due to hormonal changes after delivery. These feelings should not last more than a few days. If these feelings do not go away after several days, speak with your health care provider. °How should I care for myself? °· Tell your health care provider if you have pain or discomfort. °· Drink enough water to keep your urine clear or pale yellow. °· Wash your hands thoroughly with soap and water for at least 20 seconds after changing your sanitary pads, after using the toilet, and before holding or feeding your baby. °· If you are not breastfeeding, avoid touching your breasts a lot. Doing this can make your breasts produce more milk. °· If you become weak or lightheaded, or you feel like you might faint, ask for help before: °? Getting out of bed. °? Showering. °· Change your sanitary pads frequently. Watch for any changes in your flow, such as a sudden increase in volume, a change in color, the passing of large blood clots. If you pass a blood clot from your vagina, save it   to show to your health care provider. Do not flush blood clots down the toilet without having your health care provider look at them. °· Make sure that all your vaccinations are up to date. This can help protect you and your baby from getting certain diseases. You may need to have immunizations done before you leave the hospital. °· If desired, talk with your health care provider about methods of family planning or birth control (contraception). °How can I start bonding with my baby? °Spending as much time as  possible with your baby is very important. During this time, you and your baby can get to know each other and develop a bond. Having your baby stay with you in your room (rooming in) can give you time to get to know your baby. Rooming in can also help you become comfortable caring for your baby. Breastfeeding can also help you bond with your baby. °How can I plan for returning home with my baby? °· Make sure that you have a car seat installed in your vehicle. °? Your car seat should be checked by a certified car seat installer to make sure that it is installed safely. °? Make sure that your baby fits into the car seat safely. °· Ask your health care provider any questions you have about caring for yourself or your baby. Make sure that you are able to contact your health care provider with any questions after leaving the hospital. °This information is not intended to replace advice given to you by your health care provider. Make sure you discuss any questions you have with your health care provider. °Document Released: 12/30/2006 Document Revised: 08/07/2015 Document Reviewed: 02/06/2015 °Elsevier Interactive Patient Education © 2018 [employer] Inc[employer] ° °

## 2017-01-29 NOTE — Lactation Note (Signed)
This note was copied from a baby's chart. Lactation Consultation Note  Patient Name: Diana Serrano [id number] Date: 01/29/2017 Reason for consult: Follow-up assessment Mom states feedings are going well.  Baby observed at breast with good latch and active suck/swallows.  Breasts are still soft.  Discussed milk coming to volume and engorgement treatment.  Mom has a breast pump for home use.  Baby has been cluster feeding.  Mom denies questions.  Lactation outpatient services and support encouraged prn.  Maternal Data    Feeding Feeding Type: Breast Fed Length of feed: 30 min  LATCH Score Latch: Grasps breast easily, tongue down, lips flanged, rhythmical sucking.  Audible Swallowing: Spontaneous and intermittent  Type of Nipple: Everted at rest and after stimulation  Comfort (Breast/Nipple): Soft / non-tender  Hold (Positioning): No assistance needed to correctly position infant at breast.  LATCH Score: 10  Interventions Interventions: Breast feeding basics reviewed  Lactation Tools Discussed/Used     Consult Status Consult Status: Complete    Ave Filter 01/29/2017, 9:18 AM

## 2017-01-29 NOTE — Discharge Summary (Signed)
OB Discharge Summary     Patient Name: Diana Serrano DOB: 1986/01/30 MRN: [id number]  Date of admission: 01/27/2017 Delivering MD: Ovidio Kin   Date of discharge: 01/29/2017  Admitting diagnosis: LABOR Intrauterine pregnancy: [redacted]w[redacted]d     Secondary diagnosis:  Active Problems:   Indication for care in labor or delivery  Additional problems: Hx of migraines, GERD, BV, Trichomonal vaginitis in 2nd trimester     Discharge diagnosis: Term Pregnancy Delivered                                                                                                Post partum procedures:none  Augmentation: none  Complications: None  Hospital course:  Onset of Labor With Vaginal Delivery     31 y.o. yo [id number] at [redacted]w[redacted]d was admitted in Active Labor on 01/27/2017. Patient had an uncomplicated labor course as follows:  Membrane Rupture Time/Date: 1:05 PM ,01/27/2017   Intrapartum Procedures: Episiotomy: None [1]                                         Lacerations:  2nd degree [3];Periurethral [8]  Patient had a delivery of a Viable infant. 01/27/2017  Information for the patient's newborn:  Jalynn, Waddell [[id number]]  Delivery Method: Vaginal, Spontaneous(Filed from Delivery Summary)    Pateint had an uncomplicated postpartum course.  She is ambulating, tolerating a regular diet, passing flatus, and urinating well. Patient is discharged home in stable condition on 01/29/17.   Physical exam  Vitals:   01/27/17 2008 01/28/17 0505 01/28/17 1742 01/29/17 0535  BP: 103/62 110/68 116/65 97/62  Pulse: 81 95 100 85  Resp: 18 18 18 18   Temp: 97.6 F (36.4 C) 97.9 F (36.6 C) (!) 97.5 F (36.4 C) 97.8 F (36.6 C)  TempSrc: Oral Oral Oral Tympanic  SpO2: 100%      General: alert, cooperative and no distress Lochia: appropriate Uterine Fundus: firm Incision: N/A DVT Evaluation: No evidence of DVT seen on physical exam. No cords or calf tenderness. Labs: Lab Results   Component Value Date   WBC 8.7 01/27/2017   HGB 13.7 01/27/2017   HCT 40.0 01/27/2017   MCV 86.0 01/27/2017   PLT 285 01/27/2017   CMP Latest Ref Rng & Units 12/13/2013  Glucose 70 - 99 mg/dL 83  BUN 6 - 23 mg/dL 7  Creatinine 0.50 - 1.10 mg/dL 0.75  Sodium 137 - 147 mEq/L 141  Potassium 3.7 - 5.3 mEq/L 4.0  Chloride 96 - 112 mEq/L 104  CO2 19 - 32 mEq/L 24  Calcium 8.4 - 10.5 mg/dL 9.3  Total Protein 6.0 - 8.3 g/dL 7.2  Total Bilirubin 0.3 - 1.2 mg/dL 0.2(L)  Alkaline Phos 39 - 117 U/L 64  AST 0 - 37 U/L 11  ALT 0 - 35 U/L 14    Discharge instruction: per After Visit Summary and "Baby and Me Booklet".  After visit meds:  Allergies as of 01/29/2017   No Known Allergies  Medication List    TAKE these medications   acetaminophen 325 MG tablet Commonly known as:  TYLENOL Take 650 mg by mouth every 6 (six) hours as needed for moderate pain.   cyclobenzaprine 10 MG tablet Commonly known as:  FLEXERIL Take 1 tablet (10 mg total) by mouth 2 (two) times daily as needed for muscle spasms.   ibuprofen 600 MG tablet Commonly known as:  ADVIL,MOTRIN Take 1 tablet (600 mg total) every 6 (six) hours as needed by mouth.   prenatal multivitamin Tabs tablet Take 1 tablet by mouth daily at 12 noon.   ranitidine 150 MG tablet Commonly known as:  ZANTAC Take 150 mg by mouth daily as needed for heartburn.   senna-docusate 8.6-50 MG tablet Commonly known as:  Senokot-S Take 2 tablets at bedtime as needed by mouth for mild constipation.       Diet: routine diet  Activity: Advance as tolerated. Pelvic rest for 6 weeks.   Outpatient follow up:4 weeks Follow up Appt:No future appointments. Follow up Visit:No Follow-up on file.  Postpartum contraception: Depo Provera  Newborn Data: Live born female  Birth Weight: 7 lb 8.8 oz (3425 g) APGAR: 8, 9  Newborn Delivery   Birth date/time:  01/27/2017 13:12:00 Delivery type:  Vaginal, Spontaneous     Baby Feeding:  Breast Disposition:home with mother   01/29/2017 Nuala Alpha, DO

## 2018-01-26 ENCOUNTER — Encounter: Payer: Self-pay | Admitting: Internal Medicine

## 2018-02-17 ENCOUNTER — Encounter: Payer: Self-pay | Admitting: Internal Medicine

## 2018-03-13 ENCOUNTER — Encounter: Payer: Self-pay | Admitting: Internal Medicine

## 2018-06-10 ENCOUNTER — Telehealth: Payer: Self-pay | Admitting: Internal Medicine

## 2018-06-10 NOTE — Telephone Encounter (Signed)
Pt called in per pcp offc to sched a recall colon due to family history, I did advised the pt that at this time we are scheduling urgent appts due to the covid19. Pt did advise that she is not having any issues at this time she was just calling because she was instructed by pcp offc to call and schud due to family history. Please advise on if we need to sched. or hold out.

## 2018-06-10 NOTE — Telephone Encounter (Signed)
Please place pt on a list to be called when we start scheduling procedures again that are not  Urgent.

## 2018-07-07 ENCOUNTER — Encounter: Payer: Self-pay | Admitting: Internal Medicine

## 2018-08-18 ENCOUNTER — Encounter: Payer: Self-pay | Admitting: Internal Medicine

## 2018-08-27 ENCOUNTER — Ambulatory Visit: Payer: Medicaid Other | Admitting: *Deleted

## 2018-08-27 ENCOUNTER — Other Ambulatory Visit: Payer: Self-pay

## 2018-08-27 VITALS — Ht 64.0 in | Wt 228.0 lb

## 2018-08-27 DIAGNOSIS — Z800 Family history of malignant neoplasm of digestive organs: Secondary | ICD-10-CM

## 2018-08-27 MED ORDER — NA SULFATE-K SULFATE-MG SULF 17.5-3.13-1.6 GM/177ML PO SOLN: ORAL | 0 refills | Status: DC

## 2018-08-27 NOTE — Progress Notes (Signed)
Patient's pre-visit was done today over the phone with the patient due to COVID-19 pandemic. Name,DOB and address verified. Insurance verified. Packet of Prep instructions mailed to patient including copy of a consent form and pre-procedure patient acknowledgement form-pt is aware. Patient understands to call us back with any questions or concerns.   Patient denies any allergies to eggs or soy. Patient denies any problems with anesthesia/sedation. Patient denies any oxygen use at home. Patient denies taking any diet/weight loss medications or blood thinners. EMMI education assisgned to patient on colonoscopy, this was explained and instructions given to patient.

## 2018-09-07 ENCOUNTER — Telehealth: Payer: Self-pay | Admitting: Internal Medicine

## 2018-09-07 NOTE — Telephone Encounter (Signed)

## 2018-09-07 NOTE — Telephone Encounter (Signed)
lmom for pt to cb and answer questions

## 2018-09-08 ENCOUNTER — Encounter: Payer: Self-pay | Admitting: Internal Medicine

## 2018-09-08 ENCOUNTER — Ambulatory Visit (AMBULATORY_SURGERY_CENTER): Payer: Medicaid Other | Admitting: Internal Medicine

## 2018-09-08 ENCOUNTER — Other Ambulatory Visit: Payer: Self-pay

## 2018-09-08 VITALS — BP 102/52 | HR 20 | Temp 98.6°F | Resp 20 | Ht 64.0 in | Wt 241.0 lb

## 2018-09-08 DIAGNOSIS — D122 Benign neoplasm of ascending colon: Secondary | ICD-10-CM

## 2018-09-08 DIAGNOSIS — Z1211 Encounter for screening for malignant neoplasm of colon: Secondary | ICD-10-CM | POA: Diagnosis not present

## 2018-09-08 DIAGNOSIS — D123 Benign neoplasm of transverse colon: Secondary | ICD-10-CM | POA: Diagnosis not present

## 2018-09-08 DIAGNOSIS — D124 Benign neoplasm of descending colon: Secondary | ICD-10-CM | POA: Diagnosis not present

## 2018-09-08 DIAGNOSIS — K635 Polyp of colon: Secondary | ICD-10-CM | POA: Diagnosis not present

## 2018-09-08 DIAGNOSIS — Z800 Family history of malignant neoplasm of digestive organs: Secondary | ICD-10-CM | POA: Diagnosis not present

## 2018-09-08 MED ORDER — SODIUM CHLORIDE 0.9 % IV SOLN: 500.0000 mL | Freq: Once | INTRAVENOUS | Status: DC

## 2018-09-08 NOTE — Progress Notes (Signed)
Report given to PACU, vss 

## 2018-09-08 NOTE — Op Note (Signed)
[facility] Patient Name: Diana Serrano Procedure Date: 09/08/2018 1:38 PM MRN: [id number] Endoscopist: Jerene Bears , MD Age: 33 Referring MD:  Date of Birth: [birth year] Gender: Female Account #: [id number] Procedure:                Colonoscopy Indications:              Screening in patient at increased risk: Family                            history of 1st-degree relative with colorectal                            cancer before age 45 years, Last colonoscopy 5                            years ago Medicines:                Monitored Anesthesia Care Procedure:                Pre-Anesthesia Assessment:                           - Prior to the procedure, a History and Physical                            was performed, and patient medications and                            allergies were reviewed. The patient's tolerance of                            previous anesthesia was also reviewed. The risks                            and benefits of the procedure and the sedation                            options and risks were discussed with the patient.                            All questions were answered, and informed consent                            was obtained. Prior Anticoagulants: The patient has                            taken no previous anticoagulant or antiplatelet                            agents. ASA Grade Assessment: II - A patient with                            mild systemic disease. After reviewing the risks  and benefits, the patient was deemed in                            satisfactory condition to undergo the procedure.                           After obtaining informed consent, the colonoscope                            was passed under direct vision. Throughout the                            procedure, the patient's blood pressure, pulse, and                            oxygen saturations were monitored continuously. The                   Colonoscope was introduced through the anus and                            advanced to the cecum, identified by the                            appendiceal orifice, IC valve and                            transillumination. The colonoscopy was performed                            without difficulty. The patient tolerated the                            procedure well. The quality of the bowel                            preparation was good. The ileocecal valve,                            appendiceal orifice, and rectum were photographed. Scope In: 1:42:21 PM Scope Out: 1:59:49 PM Scope Withdrawal Time: 0 hours 15 minutes 27 seconds  Total Procedure Duration: 0 hours 17 minutes 28 seconds  Findings:                 The digital rectal exam was normal.                           The terminal ileum appeared normal.                           A 8 mm polyp was found in the ascending colon. The                            polyp was sessile. The polyp was removed with a  cold snare. Resection and retrieval were complete.                           A 6 mm polyp was found in the proximal transverse                            colon. The polyp was sessile. The polyp was removed                            with a cold snare. Resection and retrieval were                            complete.                           A 4 mm polyp was found in the descending colon. The                            polyp was sessile. The polyp was removed with a                            cold snare. Resection and retrieval were complete.                           The exam was otherwise without abnormality on                            direct and retroflexion views. Complications:            No immediate complications. Estimated Blood Loss:     Estimated blood loss was minimal. Impression:               - The examined portion of the ileum was normal.                           - One 8 mm  polyp in the ascending colon, removed                            with a cold snare. Resected and retrieved.                           - One 6 mm polyp in the proximal transverse colon,                            removed with a cold snare. Resected and retrieved.                           - One 4 mm polyp in the descending colon, removed                            with a cold snare. Resected and retrieved.                           -  The examination was otherwise normal on direct                            and retroflexion views. Recommendation:           - Patient has a contact number available for                            emergencies. The signs and symptoms of potential                            delayed complications were discussed with the                            patient. Return to normal activities tomorrow.                            Written discharge instructions were provided to the                            patient.                           - Resume previous diet.                           - Continue present medications.                           - Await pathology results.                           - Repeat colonoscopy is recommended for                            surveillance. The colonoscopy date will be                            determined after pathology results from today's                            exam become available for review. Jerene Bears, MD 09/08/2018 2:03:56 PM This report has been signed electronically.

## 2018-09-08 NOTE — Progress Notes (Signed)
Pt's states no medical or surgical changes since previsit or office visit. [facility] covid screening and temp JB vital signs

## 2018-09-08 NOTE — Progress Notes (Signed)
No problems noted in the recovery room. maw 

## 2018-09-08 NOTE — Patient Instructions (Signed)
YOU HAD AN ENDOSCOPIC PROCEDURE TODAY AT THE [facility]:   Refer to the procedure report that was given to you for any specific questions about what was found during the examination.  If the procedure report does not answer your questions, please call your gastroenterologist to clarify.  If you requested that your care partner not be given the details of your procedure findings, then the procedure report has been included in a sealed envelope for you to review at your convenience later.  YOU SHOULD EXPECT: Some feelings of bloating in the abdomen. Passage of more gas than usual.  Walking can help get rid of the air that was put into your GI tract during the procedure and reduce the bloating. If you had a lower endoscopy (such as a colonoscopy or flexible sigmoidoscopy) you may notice spotting of blood in your stool or on the toilet paper. If you underwent a bowel prep for your procedure, you may not have a normal bowel movement for a few days.  Please Note:  You might notice some irritation and congestion in your nose or some drainage.  This is from the oxygen used during your procedure.  There is no need for concern and it should clear up in a day or so.  SYMPTOMS TO REPORT IMMEDIATELY:   Following lower endoscopy (colonoscopy or flexible sigmoidoscopy):  Excessive amounts of blood in the stool  Significant tenderness or worsening of abdominal pains  Swelling of the abdomen that is new, acute  Fever of 100F or higher    For urgent or emergent issues, a gastroenterologist can be reached at any hour by calling [phone].   DIET:  We do recommend a small meal at first, but then you may proceed to your regular diet.  Drink plenty of fluids but you should avoid alcoholic beverages for 24 hours.  ACTIVITY:  You should plan to take it easy for the rest of today and you should NOT DRIVE or use heavy machinery until tomorrow (because of the sedation medicines used during the test).     FOLLOW UP: Our staff will call the number listed on your records 48-72 hours following your procedure to check on you and address any questions or concerns that you may have regarding the information given to you following your procedure. If we do not reach you, we will leave a message.  We will attempt to reach you two times.  During this call, we will ask if you have developed any symptoms of COVID 19. If you develop any symptoms (ie: fever, flu-like symptoms, shortness of breath, cough etc.) before then, please call [phone].  If you test positive for Covid 19 in the 2 weeks post procedure, please call and report this information to us.    If any biopsies were taken you will be contacted by phone or by letter within the next 1-3 weeks.  Please call us at [phone] if you have not heard about the biopsies in 3 weeks.    SIGNATURES/CONFIDENTIALITY: You and/or your care partner have signed paperwork which will be entered into your electronic medical record.  These signatures attest to the fact that that the information above on your After Visit Summary has been reviewed and is understood.  Full responsibility of the confidentiality of this discharge information lies with you and/or your care-partner.    Handout was given to you on polyps.  You may resume your current medications today. Await biopsy results. Please call if any questions   or concerns.   

## 2018-09-10 ENCOUNTER — Telehealth: Payer: Self-pay

## 2018-09-10 NOTE — Telephone Encounter (Signed)
  Follow up Call-  Call back number 09/08/2018  Post procedure Call Back phone  # [phone]  Permission to leave phone message Yes  Some recent data might be hidden    1. Have you developed a fever since your procedure? no  2.   Have you had an respiratory symptoms (SOB or cough) since your procedure? no  3.   Have you tested positive for COVID 19 since your procedure no  4.   Have you had any family members/close contacts diagnosed with the COVID 19 since your procedure?  no   If yes to any of these questions please route to Joylene John, RN and Alphonsa Gin, Therapist, sports.  Patient questions:  Do you have a fever, pain , or abdominal swelling? No. Pain Score  0 *  Have you tolerated food without any problems? Yes.    Have you been able to return to your normal activities? Yes.    Do you have any questions about your discharge instructions: Diet   No. Medications  No. Follow up visit  No.  Do you have questions or concerns about your Care? No.  Actions: * If pain score is 4 or above: No action needed, pain <4.

## 2018-09-14 ENCOUNTER — Encounter: Payer: Self-pay | Admitting: Internal Medicine

## 2019-09-23 ENCOUNTER — Other Ambulatory Visit (HOSPITAL_COMMUNITY): Payer: Self-pay | Admitting: Family Medicine

## 2019-09-23 ENCOUNTER — Other Ambulatory Visit: Payer: Self-pay | Admitting: Family Medicine

## 2019-09-23 DIAGNOSIS — E049 Nontoxic goiter, unspecified: Secondary | ICD-10-CM

## 2019-09-24 ENCOUNTER — Other Ambulatory Visit (HOSPITAL_COMMUNITY): Payer: Medicaid Other

## 2019-09-27 ENCOUNTER — Ambulatory Visit (HOSPITAL_COMMUNITY)
Admission: RE | Admit: 2019-09-27 | Discharge: 2019-09-27 | Disposition: A | Discharge status: Home / Self Care | Payer: BLUE CROSS/BLUE SHIELD | Source: Ambulatory Visit | Attending: Family Medicine | Admitting: Family Medicine

## 2019-09-27 ENCOUNTER — Other Ambulatory Visit: Payer: Self-pay

## 2019-09-27 DIAGNOSIS — E049 Nontoxic goiter, unspecified: Secondary | ICD-10-CM | POA: Diagnosis present

## 2019-10-26 ENCOUNTER — Ambulatory Visit (INDEPENDENT_AMBULATORY_CARE_PROVIDER_SITE_OTHER): Payer: BC Managed Care – PPO | Admitting: "Endocrinology

## 2019-10-26 ENCOUNTER — Encounter: Payer: Self-pay | Admitting: "Endocrinology

## 2019-10-26 ENCOUNTER — Other Ambulatory Visit: Payer: Self-pay

## 2019-10-26 VITALS — BP 100/78 | HR 80 | Ht 64.0 in | Wt 251.8 lb

## 2019-10-26 DIAGNOSIS — E042 Nontoxic multinodular goiter: Secondary | ICD-10-CM

## 2019-10-26 LAB — TSH: TSH: 0.49 (ref 0.41–5.90)

## 2019-10-26 NOTE — Patient Instructions (Signed)
Please Fax your most recent thyroid labs for review.

## 2019-10-26 NOTE — Progress Notes (Signed)
Endocrinology Consult Note                                            10/26/2019, 12:55 PM   Subjective:    Patient ID: Diana Serrano, female    DOB: 1985/07/16, PCP [facility]   Past Medical History:  Diagnosis Date  . Bacterial vaginitis 09/06/2016  . Bronchitis   . GERD (gastroesophageal reflux disease)   . Migraine    Past Surgical History:  Procedure Laterality Date  . COLONOSCOPY  02/22/2013   hyperplastic polyp  . WISDOM TOOTH EXTRACTION     Social History   Socioeconomic History  . Marital status: Single    Spouse name: Not on file  . Number of children: Not on file  . Years of education: Not on file  . Highest education level: Not on file  Occupational History  . Not on file  Tobacco Use  . Smoking status: Former Smoker    Types: Cigarettes    Quit date: 06/01/2012    Years since quitting: 7.4  . Smokeless tobacco: Never Used  Vaping Use  . Vaping Use: Never used  Substance and Sexual Activity  . Alcohol use: Yes    Comment: occasionally  . Drug use: No  . Sexual activity: Yes    Birth control/protection: None    Comment: last intercourse Sep 23rd  Other Topics Concern  . Not on file  Social History Narrative  . Not on file   Social Determinants of Health   Financial Resource Strain:   . Difficulty of Paying Living Expenses:   Food Insecurity:   . Worried About Charity fundraiser in the Last Year:   . Arboriculturist in the Last Year:   Transportation Needs:   . Film/video editor (Medical):   Marland Kitchen Lack of Transportation (Non-Medical):   Physical Activity:   . Days of Exercise per Week:   . Minutes of Exercise per Session:   Stress:   . Feeling of Stress :   Social Connections:   . Frequency of Communication with Friends and Family:   . Frequency of Social Gatherings with Friends and Family:   . Attends Religious Services:   . Active Member of Clubs or Organizations:   . Attends Theatre manager Meetings:   Marland Kitchen Marital Status:    Family History  Problem Relation Age of Onset  . Colon polyps Mother   . Colon cancer Mother        died age 36  . Hypertension Mother   . Diabetes Paternal Grandfather   . Colon cancer Brother 40  . Esophageal cancer Neg Hx   . Rectal cancer Neg Hx   . Stomach cancer Neg Hx    Outpatient Encounter Medications as of 10/26/2019  Medication Sig  . Prenatal Vit-Fe Fumarate-FA (PRENATAL MULTIVITAMIN) TABS tablet Take 1 tablet by mouth daily at 12 noon.  Marland Kitchen BIOTIN 5000 PO Take by mouth.  . cholecalciferol (VITAMIN D) 25 MCG (1000 UNIT) tablet Take 1,000 Units by mouth daily.  Marland Kitchen ibuprofen (ADVIL,MOTRIN) 600 MG tablet Take 1 tablet (600 mg total) every 6 (six) hours as needed by mouth.  . meclizine (ANTIVERT) 25 MG tablet   . [DISCONTINUED] Na Sulfate-K Sulfate-Mg Sulf 17.5-3.13-1.6 GM/177ML SOLN Suprep (no substitutions)-TAKE AS DIRECTED.  No facility-administered encounter medications on file as of 10/26/2019.   ALLERGIES: No Known Allergies  VACCINATION STATUS: There is no immunization history for the selected administration types on file for this patient.  HPI [name] is 34 y.o. female who presents today with a medical history as above. she is being seen in consultation for multinodular goiter requested by [facility].  History is obtained directly from the patient.  She denies any prior history of thyroid dysfunction.  She has noticed enlarged thyroid approximately 2 months ago after one of her providers pointed out to her.  She reports some difficulty swallowing.  She denies any family history of thyroid malignancy.  She denies exposure to neck radiation.  Her subsequent thyroid ultrasound confirms large multinodular goiter.  She also had thyroid function tests at her primary care doctor's office, not reported to review. -He denies recent major weight change, palpitations, tremors, heat/cold  intolerance.  Review of Systems  Constitutional: no recent weight gain/loss, no fatigue, no subjective hyperthermia, no subjective hypothermia Eyes: no blurry vision, no xerophthalmia ENT: no sore throat, no nodules palpated in throat, no dysphagia/odynophagia, no hoarseness Cardiovascular: no Chest Pain, no Shortness of Breath, no palpitations, no leg swelling Respiratory: no cough, no shortness of breath Gastrointestinal: no Nausea/Vomiting/Diarhhea Musculoskeletal: no muscle/joint aches Skin: no rashes Neurological: no tremors, no numbness, no tingling, no dizziness Psychiatric: no depression, no anxiety  Objective:    Vitals with BMI 10/26/2019 09/08/2018 09/08/2018  Height 5\' 4"  - -  Weight 251 lbs 13 oz - -  BMI 97.9 - -  Systolic 892 119 93  Diastolic 78 52 61  Pulse 80 20 22    BP 100/78   Pulse 80   Ht 5\' 4"  (1.626 m)   Wt 251 lb 12.8 oz (114.2 kg)   BMI 43.22 kg/m   Wt Readings from Last 3 Encounters:  10/26/19 251 lb 12.8 oz (114.2 kg)  09/08/18 241 lb (109.3 kg)  08/27/18 228 lb (103.4 kg)    Physical Exam  Constitutional:  Body mass index is 43.22 kg/m.,  not in acute distress, normal state of mind Eyes: PERRLA, EOMI, no exophthalmos ENT: moist mucous membranes, no gross thyromegaly, no gross cervical lymphadenopathy Cardiovascular: normal precordial activity, Regular Rate and Rhythm, no Murmur/Rubs/Gallops Respiratory:  adequate breathing efforts, no gross chest deformity, Clear to auscultation bilaterally Gastrointestinal: abdomen soft, Non -tender, No distension, Bowel Sounds present, no gross organomegaly Musculoskeletal: no gross deformities, strength intact in all four extremities Skin: moist, warm, no rashes Neurological: no tremor with outstretched hands, Deep tendon reflexes normal in bilateral lower extremities.  CMP ( most recent) CMP     Component Value Date/Time   NA 141 12/13/2013 1914   K 4.0 12/13/2013 1914   CL 104 12/13/2013 1914    CO2 24 12/13/2013 1914   GLUCOSE 83 12/13/2013 1914   BUN 7 12/13/2013 1914   CREATININE 0.75 12/13/2013 1914   CALCIUM 9.3 12/13/2013 1914   PROT 7.2 12/13/2013 1914   ALBUMIN 3.8 12/13/2013 1914   AST 11 12/13/2013 1914   ALT 14 12/13/2013 1914   ALKPHOS 64 12/13/2013 1914   BILITOT 0.2 (L) 12/13/2013 1914   GFRNONAA >90 12/13/2013 1914   GFRAA >90 12/13/2013 1914      Thyroid ultrasound on September 27, 2019  Right lobe: 7.7 x 4.4 x 3.5 mm, left lobe 5.3 x 2.0 x 2.3 cm. IMPRESSION: 1. Multinodular goiter. 2. Dominant nodule is along the  superior isthmus and measures up to 6.3 cm. This is labeled as nodule 1 and compatible with a TR 3 nodule. This nodule meets criteria for ultrasound-guided biopsy. 3. Nodule 2 and nodule 3 in the right thyroid lobe both meet criteria for ultrasound-guided biopsy. Nodule 3 is the most suspicious based on the hypoechoic echogenicity. Based on TI-RADS recommendations, recommend biopsying nodule 1 and nodule 3. 4. Recommend 1 year follow-up of nodule 2 and nodule 4.   Assessment & Plan:   1. Multinodular goiter  - FRAYA UEDA  is being seen at a kind request of [facility] have reviewed her available thyroid records and clinically evaluated the patient. 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  -Per radiology report, nodule #1 and #3 should be considered for fine-needle aspiration. -She will fax over her recent thyroid function test results for review.  She will return with her biopsy results in 2 to 3 weeks time.  Further action will depend on biopsy results.  This patient may eventually need thyroidectomy for compressive symptoms in light of her large goiter. - I did not initiate any new prescriptions today. - she is advised to maintain close follow up with [facility] for primary care  needs.   - Time spent with the patient: 60 minutes, of which >50% was spent in  counseling her about her large multinodular goiter and the rest in obtaining information about her symptoms, reviewing her previous labs/studies ( including abstractions from other facilities),  evaluations, and treatments,  and developing a plan to confirm diagnosis and long term treatment based on the latest standards of care/guidelines; and documenting her care.  Diana Serrano participated in the discussions, expressed understanding, and voiced agreement with the above plans.  All questions were answered to her satisfaction. she is encouraged to contact clinic should she have any questions or concerns prior to her return visit.  Follow up plan: Return in about 2 weeks (around 11/09/2019) for F/U with Biopsy Results.   Glade Lloyd, MD [facility] [address] [place], [place] [address] Phone: [phone]  Fax: [phone]     10/26/2019, 12:55 PM  This note was partially dictated with voice recognition software. Similar sounding words can be transcribed inadequately or may not  be corrected upon review.

## 2019-11-04 ENCOUNTER — Ambulatory Visit (HOSPITAL_COMMUNITY)
Admission: RE | Admit: 2019-11-04 | Discharge: 2019-11-04 | Disposition: A | Discharge status: Home / Self Care | Payer: BC Managed Care – PPO | Source: Ambulatory Visit | Attending: "Endocrinology | Admitting: "Endocrinology

## 2019-11-04 ENCOUNTER — Other Ambulatory Visit: Payer: Self-pay

## 2019-11-04 ENCOUNTER — Other Ambulatory Visit: Payer: Self-pay | Admitting: "Endocrinology

## 2019-11-04 ENCOUNTER — Encounter (HOSPITAL_COMMUNITY): Payer: Self-pay

## 2019-11-04 DIAGNOSIS — E042 Nontoxic multinodular goiter: Secondary | ICD-10-CM

## 2019-11-04 MED ORDER — LIDOCAINE HCL (PF) 2 % IJ SOLN
INTRAMUSCULAR | Status: AC
  Filled 2019-11-04: qty 10

## 2019-11-04 NOTE — Procedures (Signed)
PreOperative Dx: Multiple thyroid nodules Postoperative Dx: Multiple thyroid nodules Procedure:   US guided FNA of RIGHT and isthmic thyroid nodules Radiologist:  Thornton Papas Anesthesia:  6 ml of 2% lidocaine Specimen:  FNA x 2 of RT and FNA x 5 of isthmic  EBL:   < 1 ml Complications: None

## 2019-11-05 LAB — CYTOLOGY - NON PAP

## 2019-11-09 ENCOUNTER — Encounter: Payer: Self-pay | Admitting: "Endocrinology

## 2019-11-09 ENCOUNTER — Ambulatory Visit (INDEPENDENT_AMBULATORY_CARE_PROVIDER_SITE_OTHER): Payer: BC Managed Care – PPO | Admitting: "Endocrinology

## 2019-11-09 ENCOUNTER — Other Ambulatory Visit: Payer: Self-pay

## 2019-11-09 VITALS — BP 96/65 | HR 84 | Ht 64.0 in | Wt 251.0 lb

## 2019-11-09 DIAGNOSIS — E042 Nontoxic multinodular goiter: Secondary | ICD-10-CM | POA: Diagnosis not present

## 2019-11-09 NOTE — Progress Notes (Signed)
11/09/2019, 9:23 AM   Endocrinology follow-up note  Subjective:    Patient ID: Diana Serrano, female    DOB: 1985/09/23, PCP [facility]   Past Medical History:  Diagnosis Date   Bacterial vaginitis 09/06/2016   Bronchitis    GERD (gastroesophageal reflux disease)    Migraine    Past Surgical History:  Procedure Laterality Date   COLONOSCOPY  02/22/2013   hyperplastic polyp   WISDOM TOOTH EXTRACTION     Social History   Socioeconomic History   Marital status: Single    Spouse name: Not on file   Number of children: Not on file   Years of education: Not on file   Highest education level: Not on file  Occupational History   Not on file  Tobacco Use   Smoking status: Former Smoker    Types: Cigarettes    Quit date: 06/01/2012    Years since quitting: 7.4   Smokeless tobacco: Never Used  Vaping Use   Vaping Use: Never used  Substance and Sexual Activity   Alcohol use: Yes    Comment: occasionally   Drug use: No   Sexual activity: Yes    Birth control/protection: None    Comment: last intercourse Sep 23rd  Other Topics Concern   Not on file  Social History Narrative   Not on file   Social Determinants of Health   Financial Resource Strain:    Difficulty of Paying Living Expenses: Not on file  Food Insecurity:    Worried About Charity fundraiser in the Last Year: Not on file   [employer] of Food in the Last Year: Not on file  Transportation Needs:    Lack of Transportation (Medical): Not on file   Lack of Transportation (Non-Medical): Not on file  Physical Activity:    Days of Exercise per Week: Not on file   Minutes of Exercise per Session: Not on file  Stress:    Feeling of Stress : Not on file  Social Connections:    Frequency of Communication with Friends and Family: Not on file   Frequency of Social Gatherings with Friends and Family: Not on  file   Attends Religious Services: Not on file   Active Member of Clubs or Organizations: Not on file   Attends Archivist Meetings: Not on file   Marital Status: Not on file   Family History  Problem Relation Age of Onset   Colon polyps Mother    Colon cancer Mother        died age 20   Hypertension Mother    Diabetes Paternal Grandfather    Colon cancer Brother 26   Esophageal cancer Neg Hx    Rectal cancer Neg Hx    Stomach cancer Neg Hx    Outpatient Encounter Medications as of 11/09/2019  Medication Sig   BIOTIN 5000 PO Take by mouth.   cholecalciferol (VITAMIN D) 25 MCG (1000 UNIT) tablet Take 1,000 Units by mouth daily.   ibuprofen (ADVIL,MOTRIN) 600 MG tablet Take 1 tablet (600 mg total) every 6 (six) hours as needed by mouth.   meclizine (ANTIVERT) 25 MG tablet    Prenatal Vit-Fe Fumarate-FA (  PRENATAL MULTIVITAMIN) TABS tablet Take 1 tablet by mouth daily at 12 noon.   No facility-administered encounter medications on file as of 11/09/2019.   ALLERGIES: No Known Allergies  VACCINATION STATUS: There is no immunization history for the selected administration types on file for this patient.  HPI Diana Serrano is 34 y.o. female who presents today with a medical history as above. she is being seen in in follow-up after fine-needle aspiration of 2 nodules in her thyroid. She was seen in consultation for multinodular goiter requested by [facility].  History is obtained directly from the patient.  She denies any prior history of thyroid dysfunction.  She has noticed enlarged thyroid approximately 2 months ago after one of her providers pointed out to her.  She reports some difficulty swallowing.  She denies any family history of thyroid malignancy.  She denies exposure to neck radiation.  Her subsequent thyroid ultrasound confirms large multinodular goiter.  She also had thyroid function tests at her primary care doctor's  office, not reported to review. Fine-needle aspiration biopsy of 2 dominant nodules is negative for malignancy. Her recent thyroid function tests are consistent with euthyroidism. -He denies recent major weight change, palpitations, tremors, heat/cold intolerance.  Review of Systems  Constitutional: no recent weight gain/loss, no fatigue, no subjective hyperthermia, no subjective hypothermia Eyes: no blurry vision, no xerophthalmia ENT: no sore throat, no nodules palpated in throat, no dysphagia/odynophagia, no hoarseness Cardiovascular: no Chest Pain, no Shortness of Breath, no palpitations, no leg swelling Respiratory: no cough, no shortness of breath Gastrointestinal: no Nausea/Vomiting/Diarhhea Musculoskeletal: no muscle/joint aches Skin: no rashes Neurological: no tremors, no numbness, no tingling, no dizziness Psychiatric: no depression, no anxiety  Objective:    Vitals with BMI 11/09/2019 11/04/2019 10/26/2019  Height 5\' 4"  - 5\' 4"   Weight 251 lbs - 251 lbs 13 oz  BMI 10.62 - 69.4  Systolic 96 99 854  Diastolic 65 74 78  Pulse 84 68 80    BP 96/65    Pulse 84    Ht 5\' 4"  (1.626 m)    Wt 251 lb (113.9 kg)    BMI 43.08 kg/m   Wt Readings from Last 3 Encounters:  11/09/19 251 lb (113.9 kg)  10/26/19 251 lb 12.8 oz (114.2 kg)  09/08/18 241 lb (109.3 kg)    Physical Exam  Constitutional:  Body mass index is 43.08 kg/m.,  not in acute distress, normal state of mind Eyes: PERRLA, EOMI, no exophthalmos ENT: moist mucous membranes, + gross thyromegaly, no gross cervical lymphadenopathy  Musculoskeletal: no gross deformities, strength intact in all four extremities Skin: moist, warm, no rashes Neurological: no tremor with outstretched hands, Deep tendon reflexes normal in bilateral lower extremities.  CMP ( most recent) CMP     Component Value Date/Time   NA 141 12/13/2013 1914   K 4.0 12/13/2013 1914   CL 104 12/13/2013 1914   CO2 24 12/13/2013 1914   GLUCOSE 83  12/13/2013 1914   BUN 7 12/13/2013 1914   CREATININE 0.75 12/13/2013 1914   CALCIUM 9.3 12/13/2013 1914   PROT 7.2 12/13/2013 1914   ALBUMIN 3.8 12/13/2013 1914   AST 11 12/13/2013 1914   ALT 14 12/13/2013 1914   ALKPHOS 64 12/13/2013 1914   BILITOT 0.2 (L) 12/13/2013 1914   GFRNONAA >90 12/13/2013 1914   GFRAA >90 12/13/2013 1914      Thyroid ultrasound on September 27, 2019  Right lobe: 7.7 x 4.4 x 3.5  mm, left lobe 5.3 x 2.0 x 2.3 cm. IMPRESSION: 1. Multinodular goiter. 2. Dominant nodule is along the superior isthmus and measures up to 6.3 cm. This is labeled as nodule 1 and compatible with a TR 3 nodule. This nodule meets criteria for ultrasound-guided biopsy. 3. Nodule 2 and nodule 3 in the right thyroid lobe both meet criteria for ultrasound-guided biopsy. Nodule 3 is the most suspicious based on the hypoechoic echogenicity. Based on TI-RADS recommendations, recommend biopsying nodule 1 and nodule 3. 4. Recommend 1 year follow-up of nodule 2 and nodule 4.  Recent Results (from the past 2160 hour(s))  TSH     Status: None   Collection Time: 10/26/19 12:00 AM  Result Value Ref Range   TSH 0.49 0.41 - 5.90    Comment: T3,Total 153, T4,Free 1.0, Thyroid Peroxidase Antibodies 2,  Cytology - Non PAP;     Status: None   Collection Time: 11/04/19 11:55 AM  Result Value Ref Range   CYTOLOGY - NON GYN      CYTOLOGY - NON PAP CASE: [id number] PATIENT: [facility] Non-Gynecological Cytology Report     Clinical History: RLL 4.1 cm Specimen Submitted:  A. THYROID GLAND, RIGHT INFERIOR, FINE NEEDLE ASPIRATION:   FINAL MICROSCOPIC DIAGNOSIS: - Consistent with benign follicular nodule (Bethesda category II)  SPECIMEN ADEQUACY: Satisfactory for evaluation  GROSS: Received is/are 3 air dried slides for diff stain, 3 slides in 95% Ethyl alcohol and 30 ccs of pale pink Cytolyt solution (CM:cm) Prepared: Smears: 6 Concentration Method (Thin Prep): 1 Cell Block:  Attempted, not obtained Additional Studies: Afirma collected     Final Diagnosis performed by Gillie Manners, MD.   Electronically signed 11/05/2019 Technical component performed at [facility], [place] [address], [place], [place] [address].  Professional component performed at [employer]. [facility], [place] [address], [place], [place] [address].  Immunohistochemistry Technical  component (if applicable) was performed at [facility]. [address], [place], [place], [place] [address].   IMMUNOHISTOCHEMISTRY DISCLAIMER (if applicable): Some of these immunohistochemical stains may have been developed and the performance characteristics determine by [facility]. Some may not have been cleared or approved by the U.S. Food and Drug Administration. The FDA has determined that such clearance or approval is not necessary. This test is used for clinical purposes. It should not be regarded as investigational or for research. This laboratory is certified under the [place] (CLIA-88) as qualified to perform high complexity clinical laboratory testing.  The controls stained appropriately.   Cytology - Non PAP;     Status: None   Collection Time: 11/04/19 12:02 PM  Result Value Ref Range   CYTOLOGY - NON GYN      Cytology - Non PAP CASE: [id number] PATIENT: [facility] Non-Gynecological Cytology Report     Clinical History: Isthmus 6.3 cm Specimen Submitted:  A. THYROID GLAND, ISTHMUS, FINE NEEDLE ASPIRATION:   FINAL MICROSCOPIC DIAGNOSIS: - Consistent with benign follicular nodule (Bethesda category II)  SPECIMEN ADEQUACY: Satisfactory for evaluation  GROSS: Received is/are 3 air dried slides for diff stain, 3 slides in 95% Ethyl alcohol and 30 ccs of pale pink Cytolyt solution (CM:cm) Prepared: Smears: 6 Concentration Method (Thin Prep): 1 Cell Block: Attempted, not  obtained Additional Studies: Afirma collected     Final Diagnosis performed by Gillie Manners, MD.   Electronically signed 11/05/2019 Technical component performed at [facility], [place] [address], [place], [place] [address].  Professional component performed at [employer]. [facility], [place] [address], [place], [place] [address].  Immunohistochemistry Technical com ponent (if applicable) was performed at [facility]. [address], [place], [place], [place] [address].   IMMUNOHISTOCHEMISTRY DISCLAIMER (if applicable): Some of these immunohistochemical stains may have been developed and the performance characteristics determine by [facility]. Some may not have been cleared or approved by the U.S. Food and Drug Administration. The FDA has determined that such clearance or approval is not necessary. This test is used for clinical purposes. It should not be regarded as investigational or for research. This laboratory is certified under the [place] (CLIA-88) as qualified to perform high complexity clinical laboratory testing.  The controls stained appropriately.      Assessment & Plan:   1. Multinodular goiter  - Diana Serrano  is being seen at a kind request of [facility] have reviewed her fine-needle aspiration biopsy results with her. She does not have thyroid malignancy. She would not need surgical treatment at this time. She does have mild compressive symptoms, advised to notify us if they are getting worse at which time she will be considered for simple thyroidectomy.  If not, she will be considered for repeat thyroid ultrasound in 1-2 years.   This patient may eventually need thyroidectomy for compressive symptoms in light of her large goiter. -Regarding her thyroid function, her labs are consistent with euthyroid state. She will not need  intervention with thyroid hormone at this time. She will need repeat thyroid function test in 6 months.   - I did not initiate any new prescriptions today. - she is advised to maintain close follow up with [facility] for primary care needs.      - Time spent on this patient care encounter:  20 minutes of which 50% was spent in  counseling and the rest reviewing  her current and  previous labs / studies and medications  doses and developing a plan for long term care. Diana Serrano  participated in the discussions, expressed understanding, and voiced agreement with the above plans.  All questions were answered to her satisfaction. she is encouraged to contact clinic should she have any questions or concerns prior to her return visit.   Follow up plan: Return in about 6 months (around 05/11/2020) for F/U with Pre-visit Labs.   Glade Lloyd, MD [facility] [address] [place], [place] [address] Phone: [phone]  Fax: [phone]     11/09/2019, 9:23 AM  This note was partially dictated with voice recognition software. Similar sounding words can be transcribed inadequately or may not  be corrected upon review.

## 2020-05-11 ENCOUNTER — Ambulatory Visit: Payer: BC Managed Care – PPO | Admitting: "Endocrinology

## 2020-10-11 IMAGING — US US THYROID
1 series · 12 of 25 positions shown · non-contrast
Comparison: None.

CLINICAL DATA: Enlargement of thyroid.

EXAM:
THYROID ULTRASOUND
TECHNIQUE: Ultrasound examination of the thyroid gland and adjacent soft
tissues was performed.

[Series 1: us thyroid · 12 of 81 slices shown]
[im 4/81]
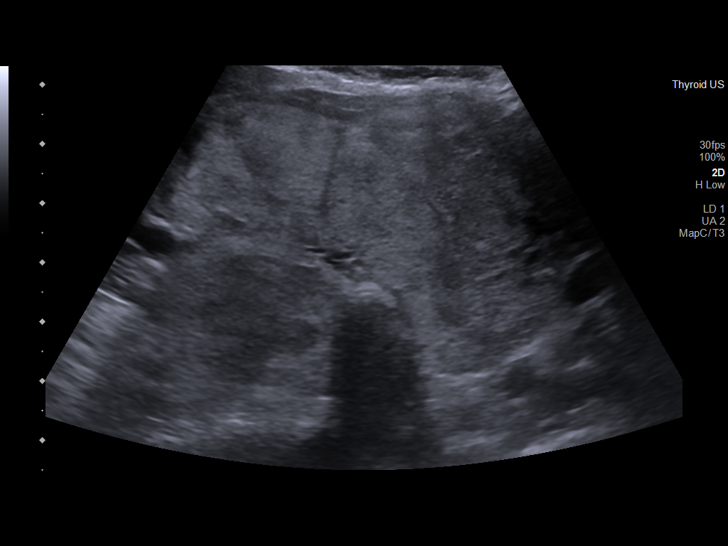
[im 11/81]
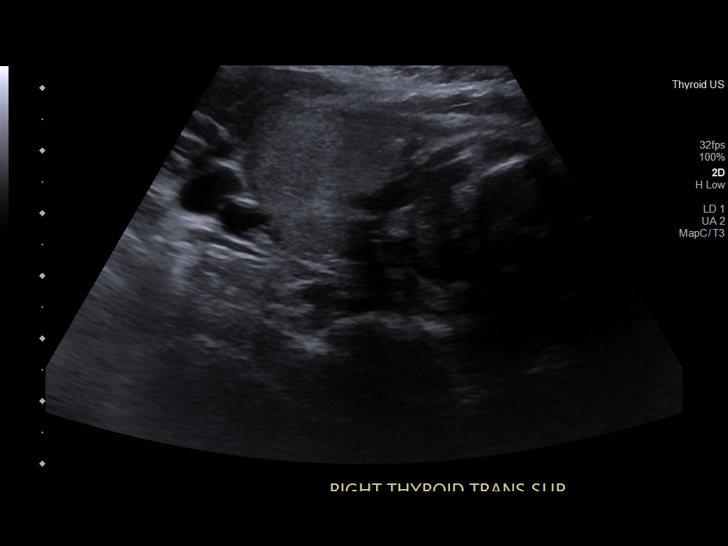
[im 17/81]
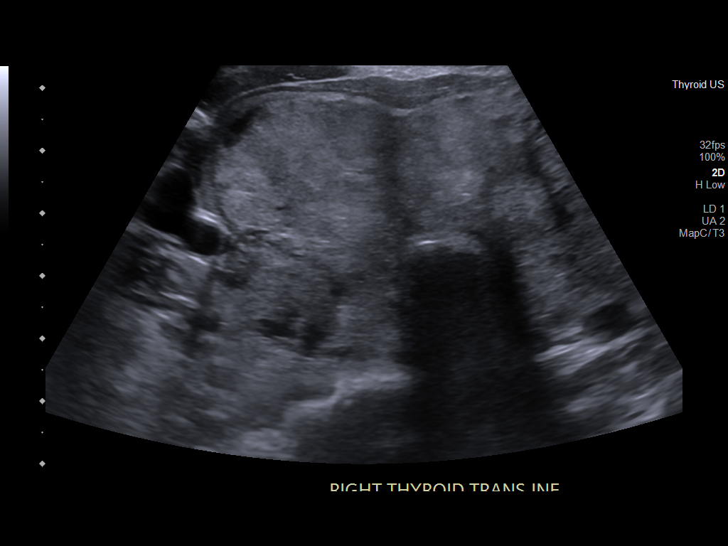
[im 24/81]
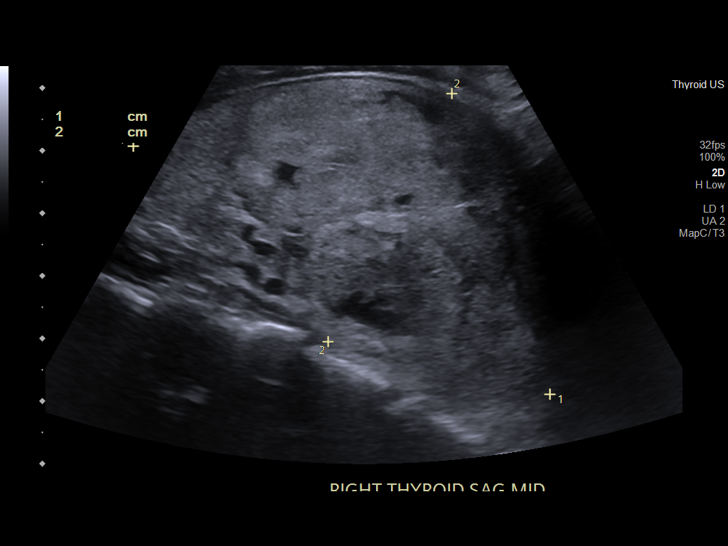
[im 31/81]
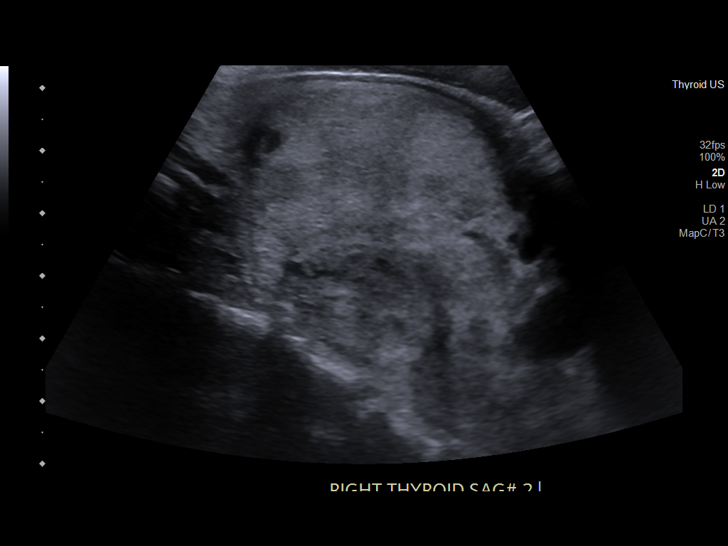
[im 37/81]
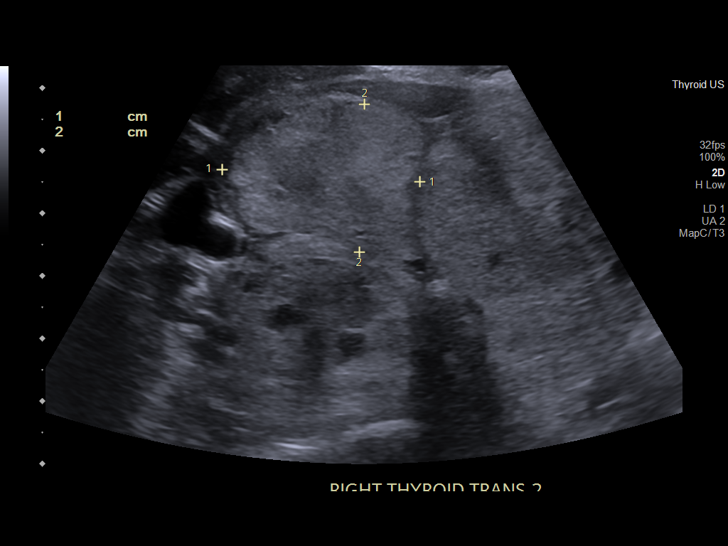
[im 44/81]
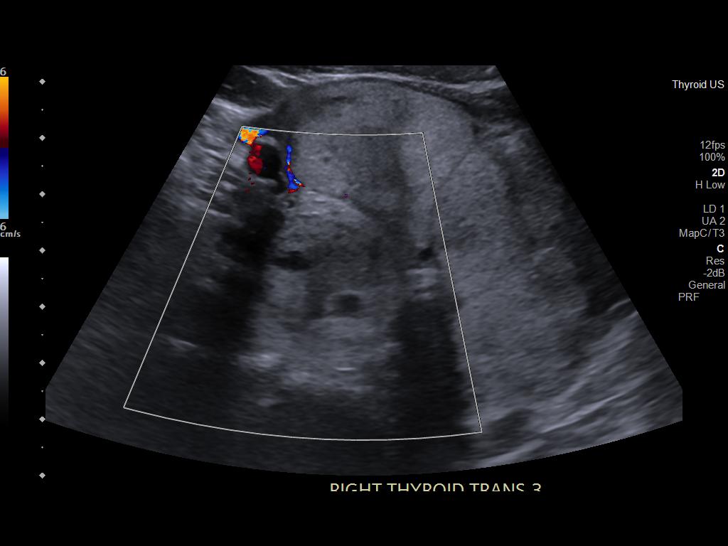
[im 51/81]
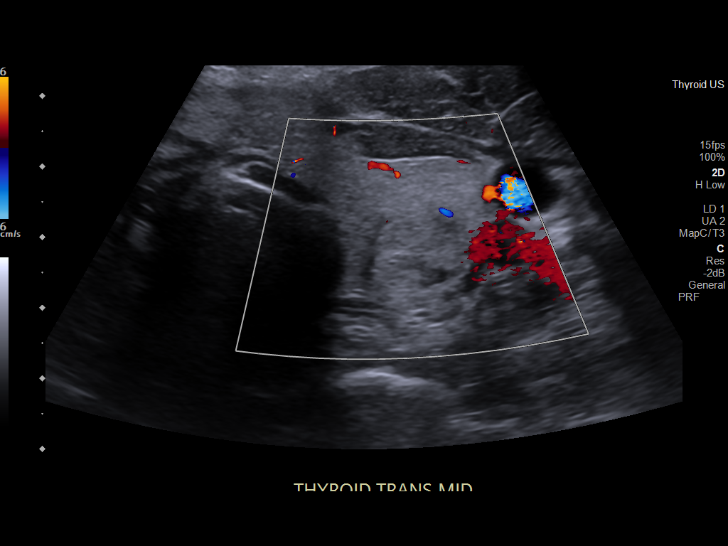
[im 57/81]
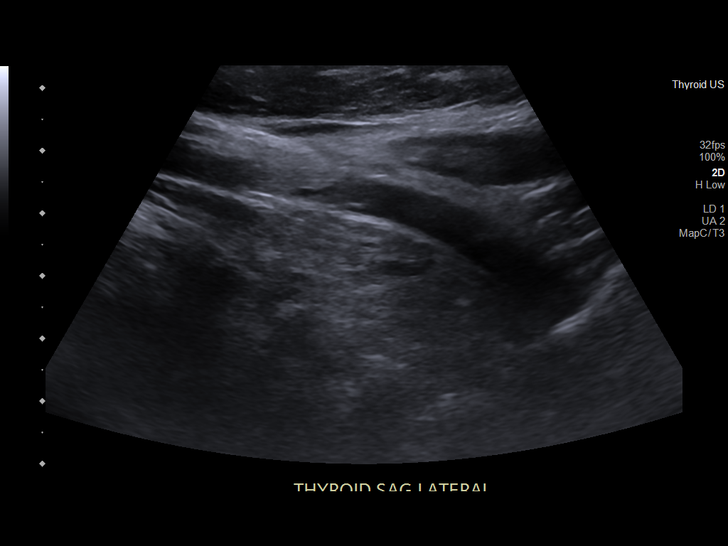
[im 64/81]
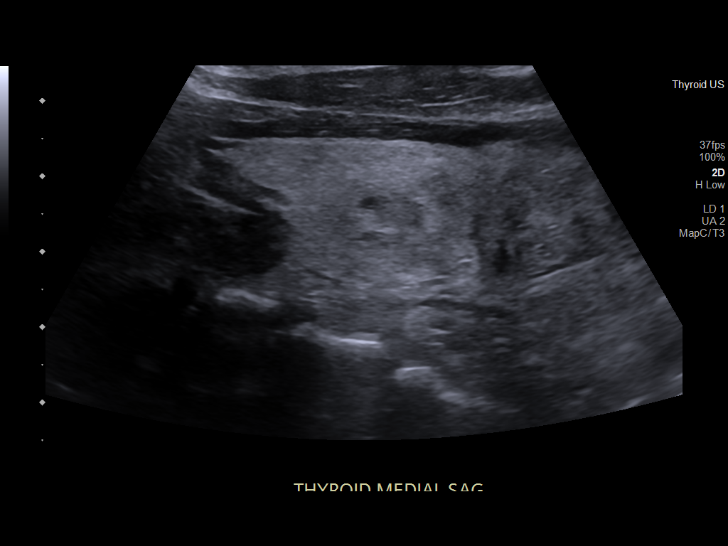
[im 71/81]
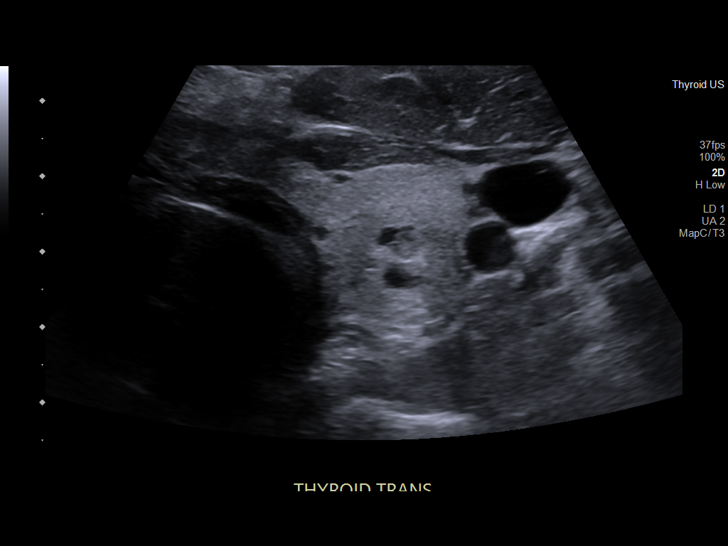
[im 77/81]
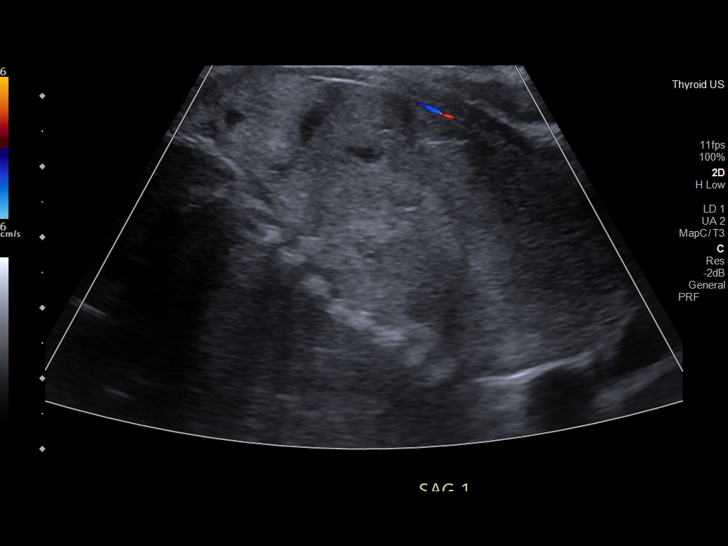

[12 of 25 positions shown; findings below may reference images not displayed]

FINDINGS: Parenchymal Echotexture: Moderately heterogenous

Isthmus: 2.0 cm

Right lobe: 7.7 x 4.4 x 3.5 cm

Left lobe: 5.3 x 2.0 x 2.3 cm

_________________________________________________________

Estimated total number of nodules >/= 1 cm: 4

Number of spongiform nodules >/=  2 cm not described below (TR1): 0

Number of mixed cystic and solid nodules >/= 1.5 cm not described
below (TR2): 0

_________________________________________________________

Nodule # 1:

Location: Isthmus; Superior

Maximum size: 6.3 cm; Other 2 dimensions: 4.3 x 4.4 cm

Composition: solid/almost completely solid (2)

Echogenicity: isoechoic (1)

Shape: not taller-than-wide (0)

Margins: smooth (0)

Echogenic foci: none (0)

ACR TI-RADS total points: 3.

ACR TI-RADS risk category: TR3 (3 points).

ACR TI-RADS recommendations:

**Given size (>/= 2.5 cm) and appearance, fine needle aspiration of
this mildly suspicious nodule should be considered based on TI-RADS
criteria.

_________________________________________________________

Nodule 2 in the superior right thyroid lobe is a solid isoechoic
nodule with poorly defined margins. Nodule 2 measures 4.2 x 2.4 x
3.2 cm. No echogenic foci. This is compatible with a TR 3 nodule.
**Given size (>/= 2.5 cm) and appearance, fine needle aspiration of
this mildly suspicious nodule should be considered based on TI-RADS
criteria.

Nodule 3 in the inferior right thyroid lobe is mildly heterogeneous
and slightly hypoechoic. This nodule is predominantly solid and
measures 4.1 x 2.5 x 2.6 cm. No echogenic foci. Based on the mild
hypoechogenicity, this would be compatible with a TR 4 nodule.
**Given size (>/= 1.5 cm) and appearance, fine needle aspiration of
this moderately suspicious nodule should be considered based on
TI-RADS criteria.

Nodule 4 in the mid left thyroid is a predominantly solid nodule but
there are cystic components. This nodule measures 1.5 x 1.0 x 1.2 cm
with poorly defined margins. Echogenicity is isoechoic. This is
compatible with a TR 3 nodule. *Given size (>/= 1.5 - 2.4 cm) and
appearance, a follow-up ultrasound in 1 year should be considered
based on TI-RADS criteria.
IMPRESSION: 1. Multinodular goiter.
2. Dominant nodule is along the superior isthmus and measures up to
6.3 cm. This is labeled as nodule 1 and compatible with a TR 3
nodule. This nodule meets criteria for ultrasound-guided biopsy.
3. Nodule 2 and nodule 3 in the right thyroid lobe both meet
criteria for ultrasound-guided biopsy. Nodule 3 is the most
suspicious based on the hypoechoic echogenicity. Based on TI-RADS
recommendations, recommend biopsying nodule 1 and nodule 3.
4. Recommend 1 year follow-up of nodule 2 and nodule 4.

The above is in keeping with the ACR TI-RADS recommendations - [HOSPITAL] 8854;[DATE].

## 2020-11-18 IMAGING — US US FNA BIOPSY THYROID 1ST LESION
1 series · 13 of 23 positions shown · non-contrast
Comparison: Thyroid ultrasound 09/27/2019

MEDICATIONS:
None

COMPLICATIONS:
None immediate.

INDICATION: Indeterminate thyroid nodule

EXAM:
ULTRASOUND GUIDED FINE NEEDLE ASPIRATION OF INDETERMINATE THYROID
NODULE
ULTRASOUND GUIDED FINE NEEDLE ASPIRATION OF 2ND INDETERMINATE
THYROID NODULE
TECHNIQUE: Informed written consent was obtained from the patient after a
discussion of the risks, benefits and alternatives to treatment.
Questions regarding the procedure were encouraged and answered. A
timeout was performed prior to the initiation of the procedure.

[Series 1: us fna bx thyroid 1st lesion afirma · 23 acquisitions, 13 frames shown]
[im 1/23]
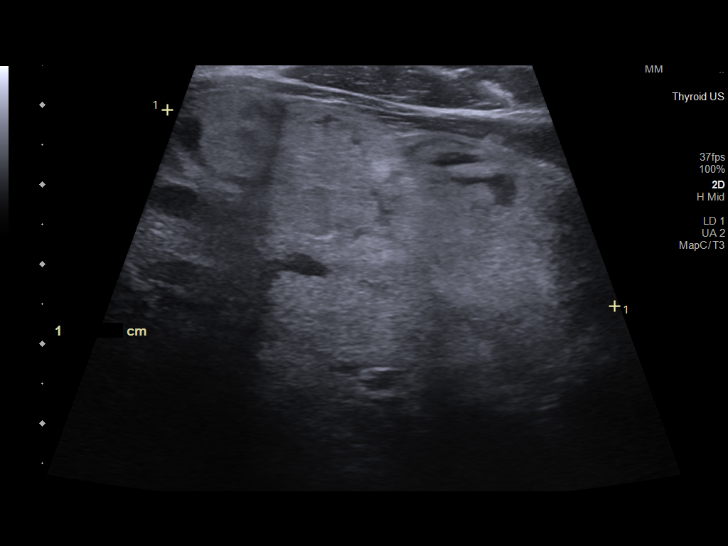
[im 3/23]
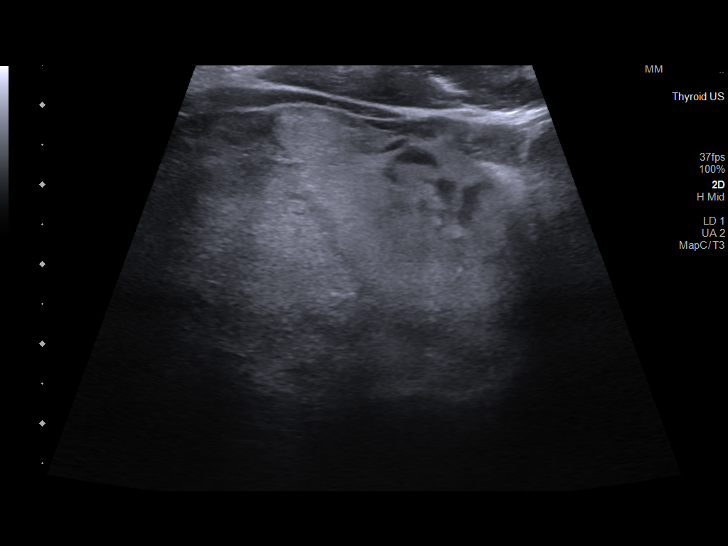
[im 5/23]
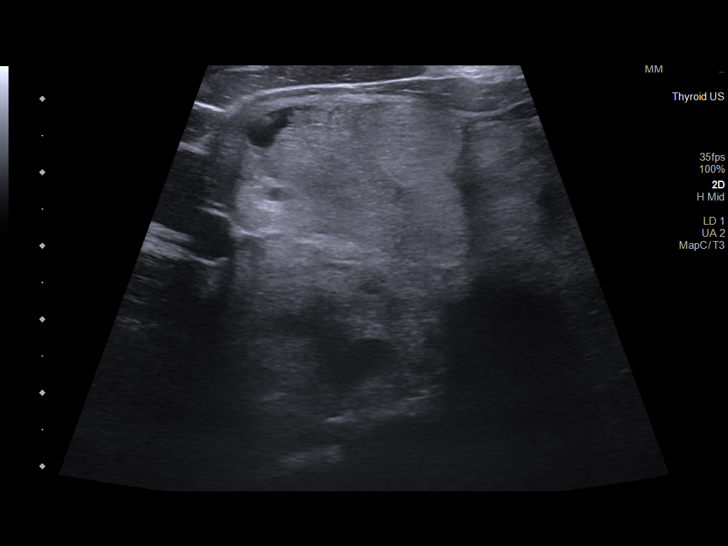
[im 7/23]
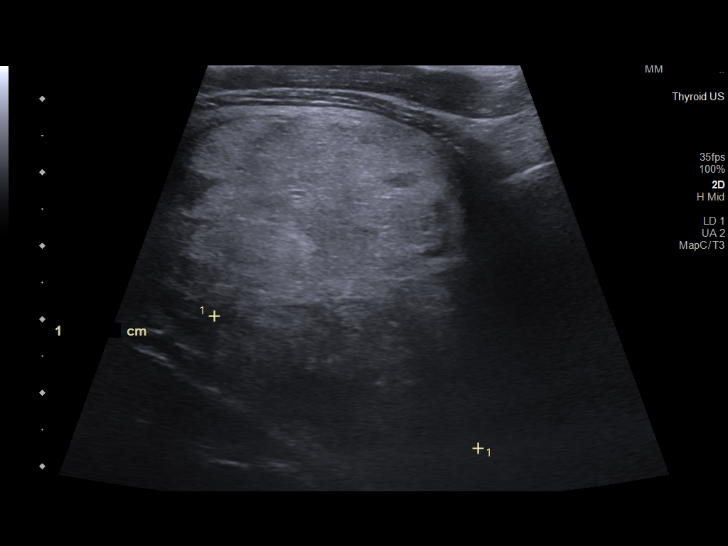
[im 8/23]
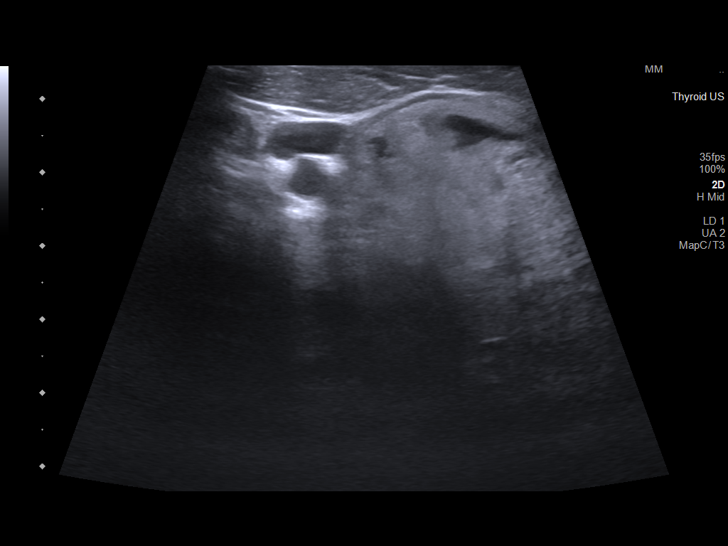
[im 10/23]
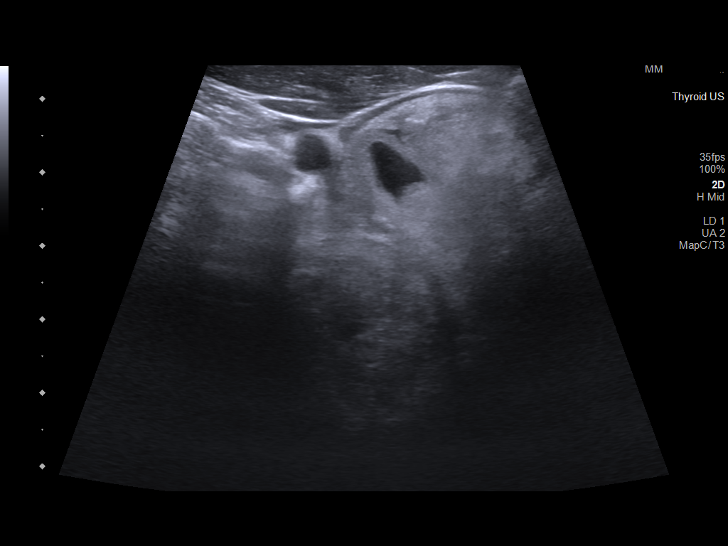
[im 12/23]
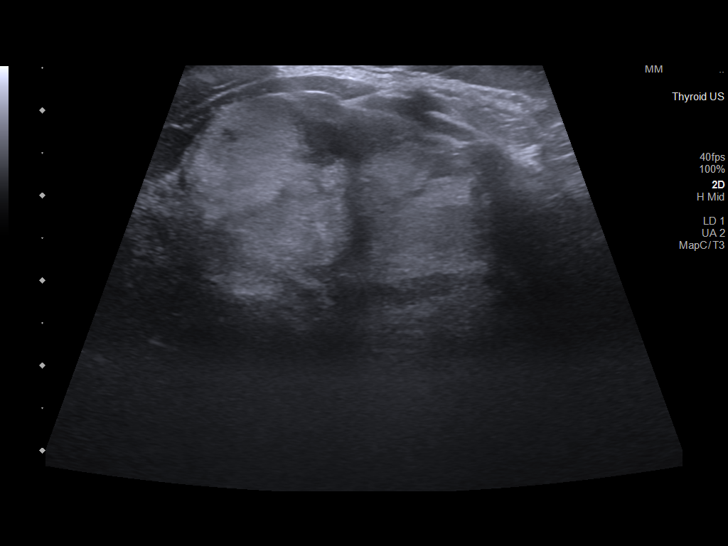
[im 14/23]
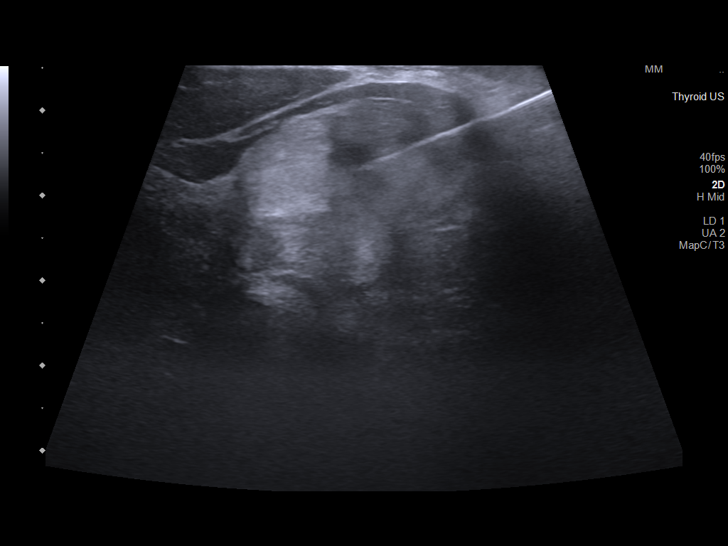
[im 16/23]
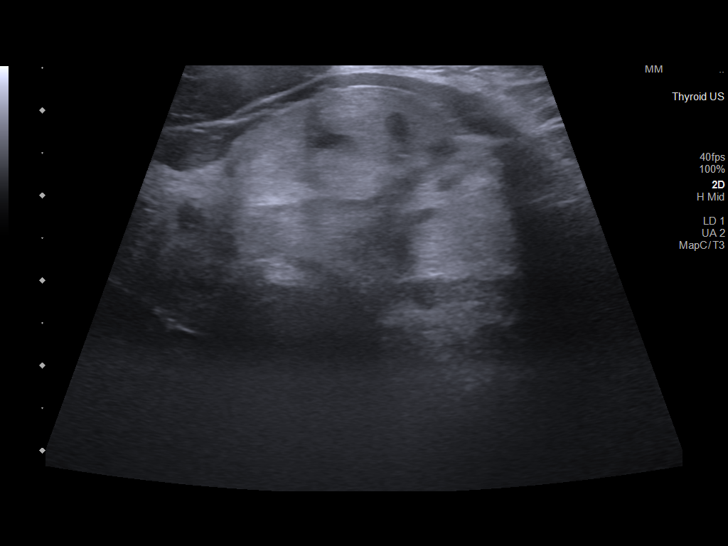
[im 17/23]
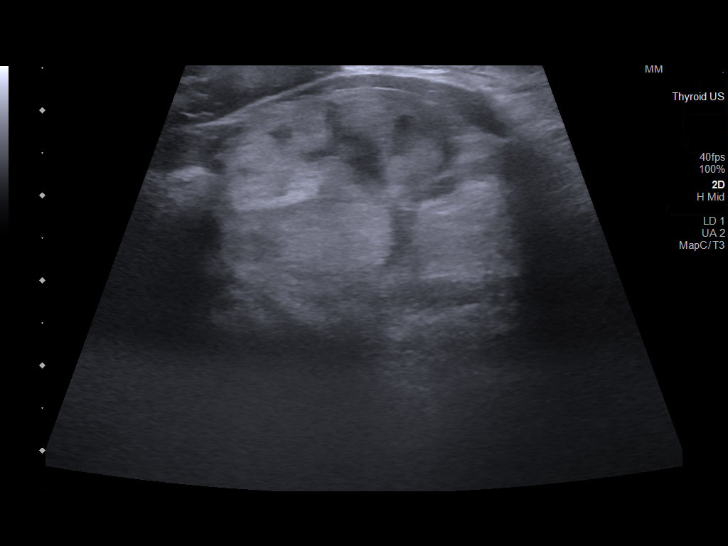
[im 19/23]
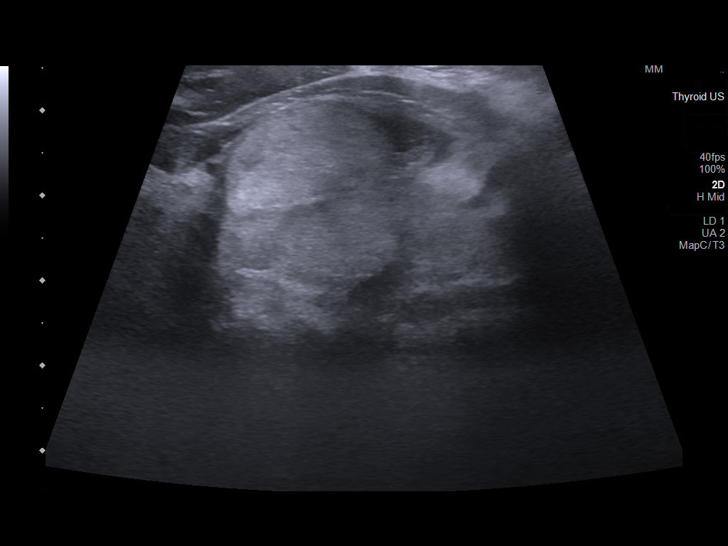
[im 21/23]
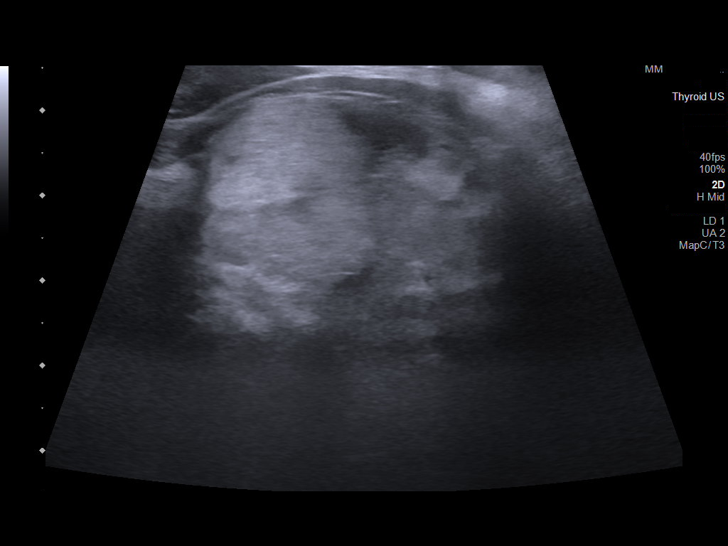
[im 23/23]
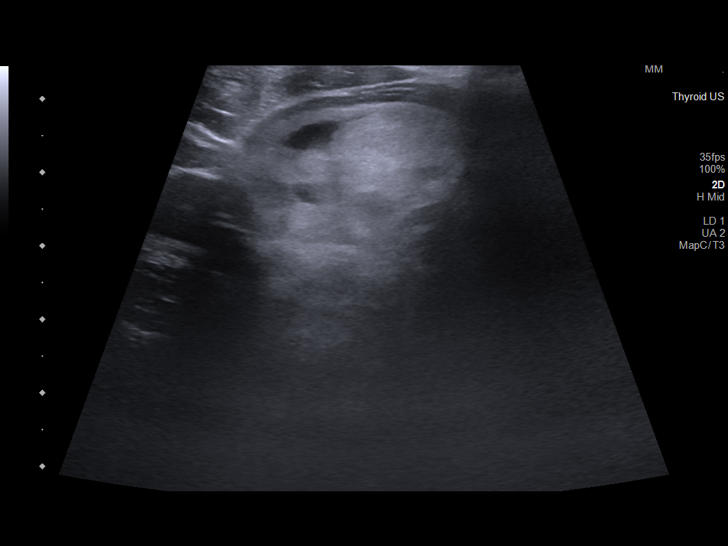

[13 of 23 positions shown; findings below may reference images not displayed]

Pre-procedural ultrasound scanning demonstrated unchanged size and
appearance of the indeterminate nodules within the LEFT isthmus and
inferior RIGHT lobe

The procedure was planned. The neck was prepped in the usual sterile
fashion, and a sterile drape was applied covering the operative
field. A timeout was performed prior to the initiation of the
procedure. Local anesthesia was provided with 1% lidocaine.

Under direct ultrasound guidance, 1 FNA biopsies were performed of
the posterior RIGHT inferior nodule with a 22 gauge spinal needle.
Multiple ultrasound images were saved for procedural documentation
purposes. Large volume cm pole was obtained. The samples were
prepared and submitted to pathology. Additional FNA was performed
with a 22 gauge spinal needle for Afirma testing, if required.

Under direct ultrasound guidance, 3 FNA biopsies were performed of
the LEFT isthmic with a 25 gauge needle. Multiple ultrasound images
were saved for procedural documentation purposes. The samples were
prepared and submitted to pathology. Two additional FNA biopsies
were performed for Afirma testing, if required.

Limited post procedural scanning was negative for hematoma or
additional complication. Dressings were placed. The patient
tolerated the above procedures procedure well without immediate
postprocedural complication.
FINDINGS: Nodule reference number based on prior diagnostic ultrasound: 3

Maximum size: 4.1 cm

Location: RIGHT; Inferior

ACR TI-RADS risk category: TR4 (4-6 points)

Reason for biopsy: meets ACR TI-RADS criteria

_________________________________________________________

Nodule reference number based on prior diagnostic ultrasound: 1

Maximum size: 6.3 cm

Location: Isthmus; Superior/LEFT

ACR TI-RADS risk category: TR3 (3 points)

Reason for biopsy: meets ACR TI-RADS criteria

Ultrasound imaging confirms appropriate placement of the needles
within the thyroid nodule.
IMPRESSION: Technically successful ultrasound guided fine needle aspirations of
RIGHT lobe and LEFT isthmic nodules.

## 2021-10-05 ENCOUNTER — Encounter: Payer: Self-pay | Admitting: Internal Medicine
